# Patient Record
Sex: Male | Born: 1935 | Race: White | Hispanic: No | Marital: Married | Smoking: Former smoker
Health system: Southern US, Community
[De-identification: ages and names within clinical notes are randomized; demographics above are authoritative.]

## PROBLEM LIST (undated history)

## (undated) DIAGNOSIS — C61 Malignant neoplasm of prostate: Secondary | ICD-10-CM

## (undated) DIAGNOSIS — J189 Pneumonia, unspecified organism: Secondary | ICD-10-CM

---

## 2016-05-25 ENCOUNTER — Emergency Department (HOSPITAL_BASED_OUTPATIENT_CLINIC_OR_DEPARTMENT_OTHER): Payer: Medicare Other

## 2016-05-25 ENCOUNTER — Emergency Department (HOSPITAL_BASED_OUTPATIENT_CLINIC_OR_DEPARTMENT_OTHER)
Admission: EM | Admit: 2016-05-25 | Discharge: 2016-05-25 | Disposition: A | Discharge status: Home / Self Care | Payer: Medicare Other | Source: Home / Self Care | Attending: Emergency Medicine | Admitting: Emergency Medicine

## 2016-05-25 ENCOUNTER — Encounter (HOSPITAL_BASED_OUTPATIENT_CLINIC_OR_DEPARTMENT_OTHER): Payer: Self-pay | Admitting: *Deleted

## 2016-05-25 DIAGNOSIS — Z87891 Personal history of nicotine dependence: Secondary | ICD-10-CM

## 2016-05-25 DIAGNOSIS — Z792 Long term (current) use of antibiotics: Secondary | ICD-10-CM

## 2016-05-25 DIAGNOSIS — J069 Acute upper respiratory infection, unspecified: Secondary | ICD-10-CM

## 2016-05-25 DIAGNOSIS — R509 Fever, unspecified: Secondary | ICD-10-CM | POA: Diagnosis not present

## 2016-05-25 DIAGNOSIS — J189 Pneumonia, unspecified organism: Secondary | ICD-10-CM | POA: Diagnosis not present

## 2016-05-25 DIAGNOSIS — Z8546 Personal history of malignant neoplasm of prostate: Secondary | ICD-10-CM | POA: Insufficient documentation

## 2016-05-25 DIAGNOSIS — R05 Cough: Secondary | ICD-10-CM

## 2016-05-25 DIAGNOSIS — R059 Cough, unspecified: Secondary | ICD-10-CM

## 2016-05-25 HISTORY — DX: Malignant neoplasm of prostate: C61

## 2016-05-25 NOTE — ED Provider Notes (Signed)
CSN: [id number]     Arrival date & time 05/25/16  1758 History   By signing my name below, I, Jasmyn B. Alexander, attest that this documentation has been prepared under the direction and in the presence of Lajean Saver, MD.  Electronically Signed: Tedra Coupe. Sheppard Coil, ED Scribe. 05/25/2016. 6:50 PM.  Chief Complaint  Patient presents with  . Generalized Body Aches    The history is provided by the patient and the spouse. No language interpreter was used.    HPI Comments: [place] is a 82 y.o. male, former smoker, who presents to the Emergency Department complaining of gradual onset, constant, generalized body aches and congestion x 4 days. Pt has associated nausea, productive cough, and loss of appetite. Pt's wife called PCP in [place] who prescribed pt Amoxicillin which he has been taking for the past 2 days with mild - mod improvement in symptoms. . Per pt's wife, pt has not had any recent sick contact. He does not have a pulmonary PMHx. Pt denies chest pain, fever, vomiting, diarrhea, throat soreness, or any other flu-like symptoms.       Past Medical History  Diagnosis Date  . Prostate cancer [facility])    History reviewed. No pertinent past surgical history. History reviewed. No pertinent family history. Social History  Substance Use Topics  . Smoking status: Former Research scientist (life sciences)  . Smokeless tobacco: None  . Alcohol Use: No    Review of Systems  Constitutional: Positive for appetite change. Negative for fever.  HENT: Positive for congestion and rhinorrhea. Negative for sore throat.   Eyes: Negative for pain and redness.  Respiratory: Positive for cough. Negative for shortness of breath.   Cardiovascular: Negative for chest pain and leg swelling.  Gastrointestinal: Positive for nausea. Negative for vomiting and diarrhea.  Genitourinary: Negative for dysuria.  Musculoskeletal: Positive for myalgias.  Skin: Negative for rash.  Neurological: Negative for headaches.   Psychiatric/Behavioral: Negative for confusion.  All other systems reviewed and are negative.   Allergies  Review of patient's allergies indicates no known allergies.  Home Medications   Prior to Admission medications   Medication Sig Start Date End Date Taking? Authorizing Provider  amoxicillin (AMOXIL) 500 MG capsule Take 500 mg by mouth 3 (three) times daily.   Yes Historical Provider, MD   BP 102/87 mmHg  Pulse 80  Temp(Src) 99.5 F (37.5 C)  Resp 18  Ht 5\' 7"  (1.702 m)  Wt 185 lb (83.915 kg)  BMI 28.97 kg/m2  SpO2 95% Physical Exam  Constitutional: He appears well-developed and well-nourished.  Mild low-grade fever.  HENT:  Head: Normocephalic and atraumatic.  Mouth/Throat: Oropharynx is clear and moist.  Eyes: Conjunctivae and EOM are normal. No scleral icterus.  Neck: Normal range of motion. Neck supple.  No stiffness or rigidity  Cardiovascular: Normal rate, regular rhythm, normal heart sounds and intact distal pulses.   Pulmonary/Chest: Effort normal. No respiratory distress. He has no wheezes. He has no rales.  Mild upper respiratory congestion. Non prod cough.   Abdominal: Soft. Bowel sounds are normal. He exhibits no distension. There is no tenderness. There is no rebound and no guarding.  Musculoskeletal: Normal range of motion. He exhibits no edema or tenderness.  Neurological: He is alert.  Skin: Skin is warm and dry. No rash noted.  Psychiatric: He has a normal mood and affect. Judgment normal.  Nursing note and vitals reviewed.   ED Course  Procedures (including critical care time) DIAGNOSTIC STUDIES: Oxygen Saturation is 95%  on RA, normal by my interpretation.    COORDINATION OF CARE: 6:29 PM-Discussed treatment plan which includes CXR with pt at bedside and pt agreed to plan.    Dg Chest 2 View  05/25/2016  CLINICAL DATA:  Cough for 1 week EXAM: CHEST  2 VIEW COMPARISON:  None. FINDINGS: No pneumothorax. There is elevation of the left  hemidiaphragm. Mild atelectasis is seen in the left lung base. No pulmonary nodules or masses. No focal infiltrates. The heart, hila, and mediastinum are normal. IMPRESSION: No acute abnormality identified. Mild left basilar atelectasis. Mild elevation of the left hemidiaphragm. Electronically Signed   By: Dorise Bullion III M.D   On: 05/25/2016 18:58       I have personally reviewed and evaluated these images as part of my medical decision-making.    MDM   I personally performed the services described in this documentation, which was scribed in my presence. The recorded information has been reviewed and considered. Lajean Saver, MD  Cxr.  cxr without acute infiltrate.   History/exam felt most c/w viral uri.  Patient currently appears stable for d/c.       Lajean Saver, MD 05/25/16 [place]

## 2016-05-25 NOTE — ED Notes (Signed)
Pt c/o body aches, cough congestion , fever x 1 week

## 2016-05-25 NOTE — ED Notes (Signed)
Pt stating that he started having nasal drainage which then developed into a cough. Pt denies CP. Pt states it started when his daughter turned the A/C on to a low setting. Pt is currently being treated with Amoxicillin. Pt alert and in NAD.

## 2016-05-25 NOTE — Discharge Instructions (Signed)
It was our pleasure to provide your ER care today - we hope that you feel better.  Our radiologist reviewed your xrays and indicates no indication of pneumonia on today's xrays.  Your symptoms and exam are most consistent with an upper respiratory illness which should run its course and get better over the next few days.  As you have started the amoxicillin and feel improved, complete the course of the antibiotic.  Take tylenol/advil as need for fever.   May try mucinex as need for cough/congestion.  Follow up with your doctor in 1 week if symptoms fail to improve/resolve.  Return to ER if worse, new symptoms, chest pain, increased trouble breathing, other concern.    Upper Respiratory Infection, Adult Most upper respiratory infections (URIs) are a viral infection of the air passages leading to the lungs. A URI affects the nose, throat, and upper air passages. The most common type of URI is nasopharyngitis and is typically referred to as "the common cold." URIs run their course and usually go away on their own. Most of the time, a URI does not require medical attention, but sometimes a bacterial infection in the upper airways can follow a viral infection. This is called a secondary infection. Sinus and middle ear infections are common types of secondary upper respiratory infections. Bacterial pneumonia can also complicate a URI. A URI can worsen asthma and chronic obstructive pulmonary disease (COPD). Sometimes, these complications can require emergency medical care and may be life threatening.  CAUSES Almost all URIs are caused by viruses. A virus is a type of germ and can spread from one person to another.  RISKS FACTORS You may be at risk for a URI if:   You smoke.   You have chronic heart or lung disease.  You have a weakened defense (immune) system.   You are very young or very old.   You have nasal allergies or asthma.  You work in crowded or poorly ventilated  areas.  You work in health care facilities or schools. SIGNS AND SYMPTOMS  Symptoms typically develop 2-3 days after you come in contact with a cold virus. Most viral URIs last 7-10 days. However, viral URIs from the influenza virus (flu virus) can last 14-18 days and are typically more severe. Symptoms may include:   Runny or stuffy (congested) nose.   Sneezing.   Cough.   Sore throat.   Headache.   Fatigue.   Fever.   Loss of appetite.   Pain in your forehead, behind your eyes, and over your cheekbones (sinus pain).  Muscle aches.  DIAGNOSIS  Your health care provider may diagnose a URI by:  Physical exam.  Tests to check that your symptoms are not due to another condition such as:  Strep throat.  Sinusitis.  Pneumonia.  Asthma. TREATMENT  A URI goes away on its own with time. It cannot be cured with medicines, but medicines may be prescribed or recommended to relieve symptoms. Medicines may help:  Reduce your fever.  Reduce your cough.  Relieve nasal congestion. HOME CARE INSTRUCTIONS   Take medicines only as directed by your health care provider.   Gargle warm saltwater or take cough drops to comfort your throat as directed by your health care provider.  Use a warm mist humidifier or inhale steam from a shower to increase air moisture. This may make it easier to breathe.  Drink enough fluid to keep your urine clear or pale yellow.   Eat soups and other  clear broths and maintain good nutrition.   Rest as needed.   Return to work when your temperature has returned to normal or as your health care provider advises. You may need to stay home longer to avoid infecting others. You can also use a face mask and careful hand washing to prevent spread of the virus.  Increase the usage of your inhaler if you have asthma.   Do not use any tobacco products, including cigarettes, chewing tobacco, or electronic cigarettes. If you need help quitting,  ask your health care provider. PREVENTION  The best way to protect yourself from getting a cold is to practice good hygiene.   Avoid oral or hand contact with people with cold symptoms.   Wash your hands often if contact occurs.  There is no clear evidence that vitamin C, vitamin E, echinacea, or exercise reduces the chance of developing a cold. However, it is always recommended to get plenty of rest, exercise, and practice good nutrition.  SEEK MEDICAL CARE IF:   You are getting worse rather than better.   Your symptoms are not controlled by medicine.   You have chills.  You have worsening shortness of breath.  You have brown or red mucus.  You have yellow or brown nasal discharge.  You have pain in your face, especially when you bend forward.  You have a fever.  You have swollen neck glands.  You have pain while swallowing.  You have white areas in the back of your throat. SEEK IMMEDIATE MEDICAL CARE IF:   You have severe or persistent:  Headache.  Ear pain.  Sinus pain.  Chest pain.  You have chronic lung disease and any of the following:  Wheezing.  Prolonged cough.  Coughing up blood.  A change in your usual mucus.  You have a stiff neck.  You have changes in your:  Vision.  Hearing.  Thinking.  Mood. MAKE SURE YOU:   Understand these instructions.  Will watch your condition.  Will get help right away if you are not doing well or get worse.   This information is not intended to replace advice given to you by your health care provider. Make sure you discuss any questions you have with your health care provider.   Document Released: 06/07/2001 Document Revised: 04/28/2015 Document Reviewed: 03/19/2014 Elsevier Interactive Patient Education 2016 Elsevier Inc.     Cough, Adult Coughing is a reflex that clears your throat and your airways. Coughing helps to heal and protect your lungs. It is normal to cough occasionally, but a  cough that happens with other symptoms or lasts a long time may be a sign of a condition that needs treatment. A cough may last only 2-3 weeks (acute), or it may last longer than 8 weeks (chronic). CAUSES Coughing is commonly caused by:  Breathing in substances that irritate your lungs.  A viral or bacterial respiratory infection.  Allergies.  Asthma.  Postnasal drip.  Smoking.  Acid backing up from the stomach into the esophagus (gastroesophageal reflux).  Certain medicines.  Chronic lung problems, including COPD (or rarely, lung cancer).  Other medical conditions such as heart failure. HOME CARE INSTRUCTIONS  Pay attention to any changes in your symptoms. Take these actions to help with your discomfort:  Take medicines only as told by your health care provider.  If you were prescribed an antibiotic medicine, take it as told by your health care provider. Do not stop taking the antibiotic even if you start to  feel better.  Talk with your health care provider before you take a cough suppressant medicine.  Drink enough fluid to keep your urine clear or pale yellow.  If the air is dry, use a cold steam vaporizer or humidifier in your bedroom or your home to help loosen secretions.  Avoid anything that causes you to cough at work or at home.  If your cough is worse at night, try sleeping in a semi-upright position.  Avoid cigarette smoke. If you smoke, quit smoking. If you need help quitting, ask your health care provider.  Avoid caffeine.  Avoid alcohol.  Rest as needed. SEEK MEDICAL CARE IF:   You have new symptoms.  You cough up pus.  Your cough does not get better after 2-3 weeks, or your cough gets worse.  You cannot control your cough with suppressant medicines and you are losing sleep.  You develop pain that is getting worse or pain that is not controlled with pain medicines.  You have a fever.  You have unexplained weight loss.  You have night  sweats. SEEK IMMEDIATE MEDICAL CARE IF:  You cough up blood.  You have difficulty breathing.  Your heartbeat is very fast.   This information is not intended to replace advice given to you by your health care provider. Make sure you discuss any questions you have with your health care provider.   Document Released: 06/10/2011 Document Revised: 09/02/2015 Document Reviewed: 02/18/2015 Elsevier Interactive Patient Education [employer].

## 2016-05-27 ENCOUNTER — Encounter (HOSPITAL_BASED_OUTPATIENT_CLINIC_OR_DEPARTMENT_OTHER): Payer: Self-pay | Admitting: *Deleted

## 2016-05-27 ENCOUNTER — Inpatient Hospital Stay (HOSPITAL_BASED_OUTPATIENT_CLINIC_OR_DEPARTMENT_OTHER)
Admission: EM | Admit: 2016-05-27 | Discharge: 2016-05-30 | DRG: 194 | Disposition: A | Discharge status: Home / Self Care | Payer: Medicare Other | Attending: Internal Medicine | Admitting: Internal Medicine

## 2016-05-27 ENCOUNTER — Emergency Department (HOSPITAL_BASED_OUTPATIENT_CLINIC_OR_DEPARTMENT_OTHER): Payer: Medicare Other

## 2016-05-27 DIAGNOSIS — I2510 Atherosclerotic heart disease of native coronary artery without angina pectoris: Secondary | ICD-10-CM | POA: Diagnosis present

## 2016-05-27 DIAGNOSIS — Z7982 Long term (current) use of aspirin: Secondary | ICD-10-CM

## 2016-05-27 DIAGNOSIS — R0902 Hypoxemia: Secondary | ICD-10-CM | POA: Diagnosis present

## 2016-05-27 DIAGNOSIS — Z8701 Personal history of pneumonia (recurrent): Secondary | ICD-10-CM

## 2016-05-27 DIAGNOSIS — E785 Hyperlipidemia, unspecified: Secondary | ICD-10-CM | POA: Diagnosis present

## 2016-05-27 DIAGNOSIS — Z87891 Personal history of nicotine dependence: Secondary | ICD-10-CM

## 2016-05-27 DIAGNOSIS — J189 Pneumonia, unspecified organism: Secondary | ICD-10-CM | POA: Diagnosis present

## 2016-05-27 DIAGNOSIS — I5032 Chronic diastolic (congestive) heart failure: Secondary | ICD-10-CM | POA: Diagnosis present

## 2016-05-27 DIAGNOSIS — I350 Nonrheumatic aortic (valve) stenosis: Secondary | ICD-10-CM | POA: Diagnosis present

## 2016-05-27 DIAGNOSIS — Z8546 Personal history of malignant neoplasm of prostate: Secondary | ICD-10-CM | POA: Diagnosis not present

## 2016-05-27 DIAGNOSIS — Z8249 Family history of ischemic heart disease and other diseases of the circulatory system: Secondary | ICD-10-CM | POA: Diagnosis not present

## 2016-05-27 DIAGNOSIS — R509 Fever, unspecified: Secondary | ICD-10-CM | POA: Diagnosis present

## 2016-05-27 DIAGNOSIS — R001 Bradycardia, unspecified: Secondary | ICD-10-CM | POA: Diagnosis present

## 2016-05-27 DIAGNOSIS — I959 Hypotension, unspecified: Secondary | ICD-10-CM | POA: Diagnosis present

## 2016-05-27 DIAGNOSIS — J9811 Atelectasis: Secondary | ICD-10-CM | POA: Diagnosis present

## 2016-05-27 DIAGNOSIS — I441 Atrioventricular block, second degree: Secondary | ICD-10-CM | POA: Diagnosis present

## 2016-05-27 DIAGNOSIS — Z79899 Other long term (current) drug therapy: Secondary | ICD-10-CM | POA: Diagnosis not present

## 2016-05-27 DIAGNOSIS — R011 Cardiac murmur, unspecified: Secondary | ICD-10-CM | POA: Diagnosis not present

## 2016-05-27 DIAGNOSIS — R0602 Shortness of breath: Secondary | ICD-10-CM

## 2016-05-27 HISTORY — DX: Pneumonia, unspecified organism: J18.9

## 2016-05-27 LAB — URINALYSIS, ROUTINE W REFLEX MICROSCOPIC
BILIRUBIN URINE: NEGATIVE
GLUCOSE, UA: NEGATIVE mg/dL
HGB URINE DIPSTICK: NEGATIVE
Ketones, ur: NEGATIVE mg/dL
Leukocytes, UA: NEGATIVE
NITRITE: NEGATIVE
Protein, ur: NEGATIVE mg/dL
SPECIFIC GRAVITY, URINE: 1.027 (ref 1.005–1.030)
pH: 5.5 (ref 5.0–8.0)

## 2016-05-27 LAB — COMPREHENSIVE METABOLIC PANEL
ALK PHOS: 30 U/L — AB (ref 38–126)
ALT: 36 U/L (ref 17–63)
ANION GAP: 6 (ref 5–15)
AST: 38 U/L (ref 15–41)
Albumin: 3.4 g/dL — ABNORMAL LOW (ref 3.5–5.0)
BUN: 22 mg/dL — ABNORMAL HIGH (ref 6–20)
CALCIUM: 8.3 mg/dL — AB (ref 8.9–10.3)
CHLORIDE: 104 mmol/L (ref 101–111)
CO2: 26 mmol/L (ref 22–32)
Creatinine, Ser: 1.2 mg/dL (ref 0.61–1.24)
GFR, EST NON AFRICAN AMERICAN: 55 mL/min — AB (ref >60)
Glucose, Bld: 127 mg/dL — ABNORMAL HIGH (ref 65–99)
Potassium: 3.8 mmol/L (ref 3.5–5.1)
SODIUM: 136 mmol/L (ref 135–145)
Total Bilirubin: 0.7 mg/dL (ref 0.3–1.2)
Total Protein: 6.5 g/dL (ref 6.5–8.1)

## 2016-05-27 LAB — CBC WITH DIFFERENTIAL/PLATELET
Basophils Absolute: 0 10*3/uL (ref 0.0–0.1)
Basophils Relative: 0 %
EOS ABS: 0 10*3/uL (ref 0.0–0.7)
EOS PCT: 0 %
HCT: 38.4 % — ABNORMAL LOW (ref 39.0–52.0)
Hemoglobin: 13.1 g/dL (ref 13.0–17.0)
LYMPHS ABS: 1.8 10*3/uL (ref 0.7–4.0)
Lymphocytes Relative: 26 %
MCH: 32.9 pg (ref 26.0–34.0)
MCHC: 34.1 g/dL (ref 30.0–36.0)
MCV: 96.5 fL (ref 78.0–100.0)
MONOS PCT: 10 %
Monocytes Absolute: 0.7 10*3/uL (ref 0.1–1.0)
Neutro Abs: 4.3 10*3/uL (ref 1.7–7.7)
Neutrophils Relative %: 64 %
PLATELETS: 290 10*3/uL (ref 150–400)
RBC: 3.98 MIL/uL — ABNORMAL LOW (ref 4.22–5.81)
RDW: 11.4 % — ABNORMAL LOW (ref 11.5–15.5)
WBC: 6.7 10*3/uL (ref 4.0–10.5)

## 2016-05-27 LAB — I-STAT CG4 LACTIC ACID, ED
LACTIC ACID, VENOUS: 1.58 mmol/L (ref 0.5–2.0)
Lactic Acid, Venous: 0.4 mmol/L — ABNORMAL LOW (ref 0.5–2.0)

## 2016-05-27 MED ORDER — IBUPROFEN 800 MG PO TABS
ORAL_TABLET | ORAL | Status: AC
  Administered 2016-05-27: 800 mg via ORAL
  Filled 2016-05-27: qty 1

## 2016-05-27 MED ORDER — AZITHROMYCIN 500 MG IV SOLR
INTRAVENOUS | Status: AC
  Filled 2016-05-27: qty 500

## 2016-05-27 MED ORDER — DEXTROSE 5 % IV SOLN
1.0000 g | Freq: Once | INTRAVENOUS | Status: AC
  Administered 2016-05-27: 1 g via INTRAVENOUS
  Filled 2016-05-27: qty 10

## 2016-05-27 MED ORDER — IOPAMIDOL (ISOVUE-370) INJECTION 76%
100.0000 mL | Freq: Once | INTRAVENOUS | Status: AC | PRN
  Administered 2016-05-27: 100 mL via INTRAVENOUS

## 2016-05-27 MED ORDER — IBUPROFEN 800 MG PO TABS
800.0000 mg | ORAL_TABLET | Freq: Once | ORAL | Status: AC
  Administered 2016-05-27: 800 mg via ORAL

## 2016-05-27 MED ORDER — DEXTROSE 5 % IV SOLN
500.0000 mg | Freq: Once | INTRAVENOUS | Status: AC
  Administered 2016-05-27: 500 mg via INTRAVENOUS

## 2016-05-27 MED ORDER — SODIUM CHLORIDE 0.9 % IV BOLUS (SEPSIS)
1000.0000 mL | Freq: Once | INTRAVENOUS | Status: AC
  Administered 2016-05-27: 1000 mL via INTRAVENOUS

## 2016-05-27 NOTE — ED Notes (Signed)
Family requesting to speak with provider, PA made aware and at bedside for f/u

## 2016-05-27 NOTE — ED Notes (Signed)
EMS reports that the patient was seen here on 05/25/16 for upper respiratory infection.  Was being treated with amoxicillin by his PCP from [place] and showed improvement per patient and family. EMS reports that the patient stopped the amoxicillin because he felt it was not working and called his PCP and had a prescription called in for Levaquin, which was not picked up.  This morning, at 0700, the family reports that the patient had a temperature of 105.1, was given tylenol.  Temperature was not repeated until 1600, which was 101.  EMS reports a temp of 99.5.  CBG was 198.  VS T: 99.5, HR: SR; O2sat: 94, placed on 3 l [place].  Tylenol was given po.  18 G angiocath inserted in left forearm, NS given and zofran for nausea.

## 2016-05-27 NOTE — ED Provider Notes (Signed)
CSN: [id number]     Arrival date & time 05/27/16  1707 History   First MD Initiated Contact with Patient 05/27/16 1739     Chief Complaint  Patient presents with  . Fever   HPI  Mr. Borts is an 82 year old male with PMHx of prostate cancer presenting with fever. Pt reports onset of cough and nasal congestion 1 week ago. He state that his cough is productive of thick phlegm and he feels congested in his chest. Denies associated chest pain or wheezing. Notes some mild SOB when he takes deep breaths but no SOB on exertion. He also notes associated body aches, fatigue, malaise and nausea. Denies vomiting or abdominal pain. He had called his PCP who wrote him an rx for amoxicillin. He has taken this for 3 days with some moderate improvement in symptoms. He states that this morning his fever spiked to 105 degrees. He called his PCP who called him in a prescription for levaquin but he has not filled this. He was seen in ED two days ago for same complaints and diagnosed with viral URI. He denise new symptoms since previous ED visit. He is a former smoker. Denies lung hx. He received tylenol from EMS for temperature of 99.5 but unsure of the dose.   Past Medical History  Diagnosis Date  . Prostate cancer ([place])   . Pneumonia    History reviewed. No pertinent past surgical history. No family history on file. Social History  Substance Use Topics  . Smoking status: Former Research scientist (life sciences)  . Smokeless tobacco: None  . Alcohol Use: No    Review of Systems  All other systems reviewed and are negative.     Allergies  Review of patient's allergies indicates no known allergies.  Home Medications   Prior to Admission medications   Medication Sig Start Date End Date Taking? Authorizing Provider  aspirin 81 MG tablet Take 81 mg by mouth daily.   Yes Historical Provider, MD  co-enzyme Q-10 30 MG capsule Take 30 mg by mouth 3 (three) times daily.   Yes Historical Provider, MD  Multiple Minerals-Vitamins  (CALCIUM & VIT D3 BONE HEALTH PO) Take by mouth.   Yes Historical Provider, MD  rosuvastatin (CRESTOR) 20 MG tablet Take 20 mg by mouth daily.   Yes Historical Provider, MD  amoxicillin (AMOXIL) 500 MG capsule Take 500 mg by mouth 3 (three) times daily.    Historical Provider, MD   BP 92/82 mmHg  Pulse 46  Temp(Src) 97.5 F (36.4 C) (Oral)  Resp 21  Ht 5\' 7"  (1.702 m)  Wt 81.647 kg  BMI 28.19 kg/m2  SpO2 98% Physical Exam  Constitutional: He is oriented to person, place, and time. He appears well-developed and well-nourished. No distress.  HENT:  Head: Normocephalic and atraumatic.  Right Ear: Tympanic membrane and ear canal normal.  Left Ear: Tympanic membrane and ear canal normal.  Nose: Mucosal edema present. Right sinus exhibits no maxillary sinus tenderness and no frontal sinus tenderness. Left sinus exhibits no maxillary sinus tenderness and no frontal sinus tenderness.  Mouth/Throat: Oropharynx is clear and moist. Mucous membranes are dry.  Eyes: Conjunctivae are normal. Right eye exhibits no discharge. Left eye exhibits no discharge. No scleral icterus.  Neck: Normal range of motion.  No meningeal signs  Cardiovascular: Normal rate, regular rhythm and normal heart sounds.   Pulmonary/Chest: Effort normal and breath sounds normal. No respiratory distress. He has no wheezes. He has no rales.  Abdominal: Soft. Bowel  sounds are normal. He exhibits no distension. There is no tenderness. There is no rebound and no guarding.  Musculoskeletal: Normal range of motion.  Neurological: He is alert and oriented to person, place, and time. Coordination normal.  Skin: Skin is warm and dry.  Psychiatric: He has a normal mood and affect. His behavior is normal.  Nursing note and vitals reviewed.   ED Course  Procedures (including critical care time) Labs Review Labs Reviewed  CBC WITH DIFFERENTIAL/PLATELET - Abnormal; Notable for the following:    RBC 3.98 (*)    HCT 38.4 (*)    RDW  11.4 (*)    All other components within normal limits  COMPREHENSIVE METABOLIC PANEL - Abnormal; Notable for the following:    Glucose, Bld 127 (*)    BUN 22 (*)    Calcium 8.3 (*)    Albumin 3.4 (*)    Alkaline Phosphatase 30 (*)    GFR calc non Af Amer 55 (*)    All other components within normal limits  I-STAT CG4 LACTIC ACID, ED - Abnormal; Notable for the following:    Lactic Acid, Venous 0.40 (*)    All other components within normal limits  CULTURE, BLOOD (ROUTINE X 2)  CULTURE, BLOOD (ROUTINE X 2)  URINALYSIS, ROUTINE W REFLEX MICROSCOPIC (NOT AT [facility])  I-STAT CG4 LACTIC ACID, ED    Imaging Review Dg Chest 2 View  05/27/2016  CLINICAL DATA:  Fever. EXAM: CHEST  2 VIEW COMPARISON:  May 25, 2016 FINDINGS: Elevation of the left hemidiaphragm is again identified. Mild atelectasis seen in the left greater than right base. The heart, hila, and mediastinum are unchanged. No pulmonary nodules, masses, or focal infiltrates identified. IMPRESSION: Mild atelectasis in the left lung base. No other acute abnormalities. Electronically Signed   By: Dorise Bullion III M.D   On: 05/27/2016 18:17   Ct Angio Chest Pe W/cm &/or Wo Cm  05/27/2016  CLINICAL DATA:  Hypoxia, fever, cough EXAM: CT ANGIOGRAPHY CHEST WITH CONTRAST TECHNIQUE: Multidetector CT imaging of the chest was performed using the standard protocol during bolus administration of intravenous contrast. Multiplanar CT image reconstructions and MIPs were obtained to evaluate the vascular anatomy. CONTRAST:  100 cc Isovue 370 COMPARISON:  05/27/2016 FINDINGS: Mediastinum/Nodes: No filling defects in the pulmonary arteries to suggest pulmonary emboli. Heart is upper limits normal in size. Aorta is normal caliber. No mediastinal, hilar, or axillary adenopathy. Scattered coronary artery calcifications. Lungs/Pleura: Bibasilar atelectasis. Patchy ground-glass opacities in the right upper lobe and superior segment of the right lower lobe could  reflect mild alveolitis/small airways disease. No effusion. Upper abdomen: Imaging into the upper abdomen shows no acute findings. Musculoskeletal: No acute bony abnormality or focal bone lesion. Review of the MIP images confirms the above findings. IMPRESSION: Bibasilar atelectasis. Patchy ground-glass opacities in the right upper lobe and superior segment of right lower lobe may reflect alveolitis/small airways disease. No evidence of pulmonary embolus. Coronary artery disease. Electronically Signed   By: Rolm Baptise M.D.   On: 05/27/2016 20:20   I have personally reviewed and evaluated these images and lab results as part of my medical decision-making.   EKG Interpretation   Date/Time:  Friday May 27 2016 17:13:59 EDT Ventricular Rate:  74 PR Interval:  313 QRS Duration: 120 QT Interval:  387 QTC Calculation: 429 R Axis:   27 Text Interpretation:  Sinus rhythm Atrial premature complexes Prolonged PR  interval IVCD, consider atypical RBBB Confirmed by MESNER MD, JASON  (  [id number]) on 05/27/2016 5:30:17 PM      MDM   Final diagnoses:  CAP (community acquired pneumonia)   82 year old male presenting with cough, congestion and fever with MAXIMUM TEMPERATURE of 105 prior to arrival. 3 day course of amoxicillin completed without resolution of symptoms. Temperature 103 degrees on arrival to emergency department which reduced to 97.5 after ibuprofen and fluids. Initial oxygen was 86% and patient was placed on 2 L nasal cannula which improved this. Denies history of oxygen requirement. Heart rate initially 80 but has leveled out to 50. Patient's family is at bedside state this is his baseline. Blood pressure mildly hypotensive at 105/60 on average. Patient denies symptoms of dizziness, lightheadedness, near syncope, chest pain, nausea or vomiting. Nasal congestion is noted. No oropharyngeal erythema or exudate. Mucous membranes are dry. Sclera auscultation bilaterally. Heart regular rate and rhythm.  Abdomen is soft, nontender. No leukocytosis. No elevated lactate. UA without signs of infection. Chest x-ray with left basilar atelectasis unchanged from 2 days ago. Given fever and new onset hypoxia, CT chest was ordered. This showed likely right upper lobe and right lower lobe pneumonia. Given azithromycin and ceftriaxone in the emergency department. Given patient's new onset hypoxia, temperature and elevated respiratory rate, code sepsis was called.  Discussed case with Dr. Olevia Bowens at [facility] who initially accepted the patient to stepdown. [facility] at capacity for step[facility] beds so patient case was discussed with Dr. Alcario Drought at [facility] has available stepdown beds and patient is accepted by Dr. Alcario Drought for transfer.  Lahoma Crocker Tiane Szydlowski, PA-C 05/28/16 0123  Merrily Pew, MD 05/28/16 [id number]

## 2016-05-27 NOTE — ED Notes (Signed)
Patient spouse reports patient had a temp of 105 just prior to calling ems, which they did not treat.

## 2016-05-27 NOTE — ED Notes (Signed)
Per family member pt hr runs in the 61's  Which is normal for him

## 2016-05-27 NOTE — Progress Notes (Signed)
Patient ID: Jake Acevedo, male   DOB: [birth year], 82 y.o.   MRN: [id number] Please call the floor manager at extension [phone] for admitting physician assignment once the patient arrives to the [facility] unit.    Per Josephina Gip, PA-C Chief Complaint  Patient presents with  . Fever   HPI  Jake Acevedo is an 82 year old male with PMHx of prostate cancer presenting with fever. Pt reports onset of cough and nasal congestion 1 week ago. He state that his cough is productive of thick phlegm and he feels congested in his chest. Denies associated chest pain or wheezing. Notes some mild SOB when he takes deep breaths but no SOB on exertion. He also notes associated body aches, fatigue, malaise and nausea. Denies vomiting or abdominal pain. He had called his PCP who wrote him an rx for for amoxicillin. He has taken this for 3 days with some moderate improvement in symptoms. He states that this morning his fever spiked to 105 degrees. He called his PCP who called him in a prescription for levaquin but he has not filled this. He was seen in ED two days ago for same complaints and diagnosed with viral URI. He denise new symptoms since previous ED visit. He is a former smoker. Denies lung hx. He received tylenol from EMS for temperature of 99.5.           Component Value Units   I-Stat CG4 Lactic Acid, ED [id number] (Abnormal) Collected: 05/27/16 2256   Updated: 05/27/16 2259    Specimen Type: Blood     Lactic Acid, Venous 0.40 (L) mmol/L   Urinalysis, Routine w reflex microscopic (not at [facility]) [id number] Collected: 05/27/16 1910   Updated: 05/27/16 2117    Specimen Type: Urine    Specimen Source: Urine, Clean Catch     Color, Urine YELLOW    APPearance CLEAR    Specific Gravity, Urine 1.027    pH 5.5    Glucose, UA NEGATIVE mg/dL    Hgb urine dipstick NEGATIVE    Bilirubin Urine NEGATIVE    Ketones, ur NEGATIVE mg/dL    Protein, ur NEGATIVE mg/dL    Nitrite NEGATIVE     Leukocytes, UA NEGATIVE   Blood culture (routine x 2) [id number] Collected: 05/27/16 1710   Updated: 05/27/16 1942    Specimen Type: Blood    Blood culture (routine x 2) [id number] Collected: 05/27/16 1715   Updated: 05/27/16 1941    Specimen Type: Blood    Comprehensive metabolic panel [id number] (Abnormal) Collected: 05/27/16 1715   Updated: 05/27/16 1814    Specimen Type: Blood    Specimen Source: Vein     Sodium 136 mmol/L    Potassium 3.8 mmol/L    Chloride 104 mmol/L    CO2 26 mmol/L    Glucose, Bld 127 (H) mg/dL    BUN 22 (H) mg/dL    Creatinine, Ser 1.20 mg/dL    Calcium 8.3 (L) mg/dL    Total Protein 6.5 g/dL    Albumin 3.4 (L) g/dL    AST 38 U/L    ALT 36 U/L    Alkaline Phosphatase 30 (L) U/L    Total Bilirubin 0.7 mg/dL    GFR calc non Af Amer 55 (L) mL/min    GFR calc Af Amer >60 mL/min    Anion gap 6   CBC with Differential [id number] (Abnormal) Collected: 05/27/16 1715   Updated: 05/27/16 1756    Specimen Type: Blood    Specimen Source: Vein  WBC 6.7 K/uL    RBC 3.98 (L) MIL/uL    Hemoglobin 13.1 g/dL    HCT 38.4 (L) %    MCV 96.5 fL    MCH 32.9 pg    MCHC 34.1 g/dL    RDW 11.4 (L) %    Platelets 290 K/uL    Neutrophils Relative % 64 %    Neutro Abs 4.3 K/uL    Lymphocytes Relative 26 %    Lymphs Abs 1.8 K/uL    Monocytes Relative 10 %    Monocytes Absolute 0.7 K/uL    Eosinophils Relative 0 %    Eosinophils Absolute 0.0 K/uL    Basophils Relative 0 %    Basophils Absolute 0.0 K/uL   I-Stat CG4 Lactic Acid, ED [id number] Collected: 05/27/16 1738   Updated: 05/27/16 1744    Specimen Type: Blood     Lactic Acid, Venous 1.58 mmol/L     Vent. rate 46 BPM PR interval 223 ms QRS duration 125 ms QT/QTc 496/434 ms P-R-T axes 15 40 27 Sinus bradycardia Borderline prolonged PR interval Right bundle branch block  CT angiogram of chest PE protocol.  IMPRESSION: Bibasilar  atelectasis.  Patchy ground-glass opacities in the right upper lobe and superior segment of right lower lobe may reflect alveolitis/small airways disease.  No evidence of pulmonary embolus.  Coronary artery disease.   Electronically Signed  By: Rolm Baptise M.D.  On: 05/27/2016 20:20  --------------------------------------------------------------------------------------------------------  Tennis Must, M.D. [phone].

## 2016-05-27 NOTE — ED Notes (Signed)
Pt states he is unable to urinate at this time. Pt states he tried, but nothing will happen. Pt is told that when he feels he can go to let us know.

## 2016-05-27 NOTE — ED Provider Notes (Addendum)
Medical screening examination/treatment/procedure(s) were conducted as a shared visit with non-physician practitioner(s) and myself.  I personally evaluated the patient during the encounter.  A few days' worth of cough, fever as high as 105 temporally at home. Here he is hypoxic and bradycardic. Chest x-ray negative so we ordered CT to make sure wasn't a PE and to further evaluate lungs which showed evidence of likely pneumonia. This with his hypoxia, will start abx, will admit.    EKG Interpretation   Date/Time:  Friday May 27 2016 17:13:59 EDT Ventricular Rate:  74 PR Interval:  313 QRS Duration: 120 QT Interval:  387 QTC Calculation: 429 R Axis:   27 Text Interpretation:  Sinus rhythm Atrial premature complexes Prolonged PR  interval IVCD, consider atypical RBBB Confirmed by [facility] MD, Gevon Markus  [phone]) on 05/27/2016 5:30:17 PM       Merrily Pew, MD 05/28/16 1507

## 2016-05-28 ENCOUNTER — Inpatient Hospital Stay (HOSPITAL_COMMUNITY): Payer: Medicare Other

## 2016-05-28 DIAGNOSIS — J189 Pneumonia, unspecified organism: Principal | ICD-10-CM

## 2016-05-28 DIAGNOSIS — R011 Cardiac murmur, unspecified: Secondary | ICD-10-CM

## 2016-05-28 DIAGNOSIS — I441 Atrioventricular block, second degree: Secondary | ICD-10-CM

## 2016-05-28 LAB — INFLUENZA PANEL BY PCR (TYPE A & B)
H1N1 flu by pcr: NOT DETECTED
INFLAPCR: NEGATIVE
Influenza B By PCR: NEGATIVE

## 2016-05-28 LAB — EXPECTORATED SPUTUM ASSESSMENT W REFEX TO RESP CULTURE

## 2016-05-28 LAB — TSH: TSH: 2.518 u[IU]/mL (ref 0.350–4.500)

## 2016-05-28 LAB — HIV ANTIBODY (ROUTINE TESTING W REFLEX): HIV SCREEN 4TH GENERATION: NONREACTIVE

## 2016-05-28 LAB — ECHOCARDIOGRAM COMPLETE
HEIGHTINCHES: 67 in
Weight: 2892.44 oz

## 2016-05-28 LAB — STREP PNEUMONIAE URINARY ANTIGEN: STREP PNEUMO URINARY ANTIGEN: NEGATIVE

## 2016-05-28 LAB — EXPECTORATED SPUTUM ASSESSMENT W GRAM STAIN, RFLX TO RESP C

## 2016-05-28 LAB — MRSA PCR SCREENING: MRSA by PCR: NEGATIVE

## 2016-05-28 MED ORDER — IBUPROFEN 200 MG PO TABS: 600.0000 mg | ORAL_TABLET | Freq: Four times a day (QID) | ORAL | Status: DC | PRN

## 2016-05-28 MED ORDER — ASPIRIN 81 MG PO CHEW
81.0000 mg | CHEWABLE_TABLET | Freq: Every day | ORAL | Status: DC
  Administered 2016-05-28 – 2016-05-30 (×3): 81 mg via ORAL
  Filled 2016-05-28 (×3): qty 1

## 2016-05-28 MED ORDER — SODIUM CHLORIDE 0.9 % IV BOLUS (SEPSIS)
500.0000 mL | Freq: Once | INTRAVENOUS | Status: AC
Start: 2016-05-28 — End: 2016-05-28
  Administered 2016-05-28: 500 mL via INTRAVENOUS

## 2016-05-28 MED ORDER — ROSUVASTATIN CALCIUM 20 MG PO TABS
20.0000 mg | ORAL_TABLET | Freq: Every day | ORAL | Status: DC
  Administered 2016-05-28 – 2016-05-29 (×2): 20 mg via ORAL
  Filled 2016-05-28 (×2): qty 1

## 2016-05-28 MED ORDER — ACETAMINOPHEN 325 MG PO TABS
650.0000 mg | ORAL_TABLET | Freq: Four times a day (QID) | ORAL | Status: DC | PRN
  Administered 2016-05-28 (×2): 650 mg via ORAL
  Filled 2016-05-28 (×2): qty 2

## 2016-05-28 MED ORDER — SODIUM CHLORIDE 0.9 % IV SOLN
INTRAVENOUS | Status: DC
  Administered 2016-05-28: 06:00:00 via INTRAVENOUS

## 2016-05-28 MED ORDER — SODIUM CHLORIDE 0.9 % IV SOLN: INTRAVENOUS | Status: DC

## 2016-05-28 MED ORDER — SODIUM CHLORIDE 0.9 % IV SOLN
INTRAVENOUS | Status: AC
  Administered 2016-05-28: 04:00:00 via INTRAVENOUS

## 2016-05-28 MED ORDER — DEXTROSE 5 % IV SOLN
500.0000 mg | INTRAVENOUS | Status: DC
  Administered 2016-05-28 – 2016-05-29 (×2): 500 mg via INTRAVENOUS
  Filled 2016-05-28 (×3): qty 500

## 2016-05-28 MED ORDER — ENOXAPARIN SODIUM 40 MG/0.4ML ~~LOC~~ SOLN
40.0000 mg | SUBCUTANEOUS | Status: DC
  Administered 2016-05-28 – 2016-05-30 (×3): 40 mg via SUBCUTANEOUS
  Filled 2016-05-28 (×3): qty 0.4

## 2016-05-28 MED ORDER — DEXTROSE 5 % IV SOLN
1.0000 g | INTRAVENOUS | Status: DC
  Administered 2016-05-28 – 2016-05-29 (×2): 1 g via INTRAVENOUS
  Filled 2016-05-28 (×3): qty 10

## 2016-05-28 MED ORDER — SODIUM CHLORIDE 0.9 % IV BOLUS (SEPSIS)
1000.0000 mL | Freq: Once | INTRAVENOUS | Status: AC
  Administered 2016-05-28: 1000 mL via INTRAVENOUS

## 2016-05-28 NOTE — Consult Note (Signed)
CONSULT NOTE  Date: 05/28/2016               Patient Name:  Jake Acevedo MRN: [id number]  DOB: 09/02/1933 Age / Sex: 82 y.o., male        PCP: No PCP Per Patient Primary Cardiologist: New / N[place]            Referring Physician: Ronnie Derby              Reason for Consult: bradycardia           History of Present Illness: Patient is a 82 y.o. male with a PMHx of bradycardia, prostate cancer , who was admitted to [facility] on 05/27/2016 for evaluation of temperature of 104, cough and pneumonia.  Is visiting [place] - from [place]. Was noticed to have a slow HR in the ER and we were asked to see  Talked to patient and his wife. He has had a slow HR for years.   Has had numerous stress tests over the years.     .   Developed cough and fever about a week ago.  His primary MD called in Amoxicillin .  No improvement.   PCP gave him Legaquin but he did not get it filled.   Former smoker  + family hx of CAD.     Medications: Outpatient medications: Prescriptions prior to admission  Medication Sig Dispense Refill Last Dose  . aspirin 81 MG tablet Take 81 mg by mouth daily.   05/27/2016 at 0800  . Cholecalciferol (VITAMIN D-3) 1000 units CAPS Take 1,000 Units by mouth daily.   05/24/2016  . co-enzyme Q-10 30 MG capsule Take 30 mg by mouth daily. Reported on 05/28/2016   05/24/2016  . rosuvastatin (CRESTOR) 20 MG tablet Take 20 mg by mouth daily.   05/24/2016  . amoxicillin (AMOXIL) 500 MG capsule Take 500 mg by mouth 3 (three) times daily. Reported on 05/28/2016   Not Taking at Unknown time    Current medications: Current Facility-Administered Medications  Medication Dose Route Frequency Provider Last Rate Last Dose  . 0.9 %  sodium chloride infusion   Intravenous Continuous Thurnell Lose, MD      . acetaminophen (TYLENOL) tablet 650 mg  650 mg Oral Q6H PRN Etta Quill, DO      . aspirin chewable tablet 81 mg  81 mg Oral Daily Jared M Gardner, DO      . azithromycin (ZITHROMAX) 500 mg in  dextrose 5 % 250 mL IVPB  500 mg Intravenous Q24H Jared M Gardner, DO      . cefTRIAXone (ROCEPHIN) 1 g in dextrose 5 % 50 mL IVPB  1 g Intravenous Q24H Etta Quill, DO      . enoxaparin (LOVENOX) injection 40 mg  40 mg Subcutaneous Q24H Jared M Gardner, DO      . ibuprofen (ADVIL,MOTRIN) tablet 600 mg  600 mg Oral Q6H PRN Etta Quill, DO      . rosuvastatin (CRESTOR) tablet 20 mg  20 mg Oral Daily Etta Quill, DO         No Known Allergies   Past Medical History  Diagnosis Date  . Prostate cancer ([place])   . Pneumonia     History reviewed. No pertinent past surgical history.  No family history on file.  Social History:  reports that he has quit smoking. He does not have any smokeless tobacco history on file. He reports that he  does not drink alcohol or use illicit drugs.   Review of Systems: Constitutional:  admits to fever, chills, diaphoresis, appetite change and fatigue.  HEENT: denies photophobia, eye pain, redness, hearing loss, ear pain, congestion, sore throat, rhinorrhea, sneezing, neck pain, neck stiffness and tinnitus.  Respiratory: admits to SOB,   wheezing.  Cardiovascular: denies chest pain, palpitations and leg swelling.  Gastrointestinal: denies nausea, vomiting, abdominal pain, diarrhea, constipation, blood in stool.  Genitourinary: denies dysuria, urgency, frequency, hematuria, flank pain and difficulty urinating.  Musculoskeletal: denies  myalgias, back pain, joint swelling, arthralgias and gait problem.   Skin: denies pallor, rash and wound.  Neurological: denies dizziness, seizures, syncope, weakness, light-headedness, numbness and headaches.   Hematological: denies adenopathy, easy bruising, personal or family bleeding history.  Psychiatric/ Behavioral: denies suicidal ideation, mood changes, confusion, nervousness, sleep disturbance and agitation.    Physical Exam: BP 110/62 mmHg  Pulse 64  Temp(Src) 98.8 F (37.1 C) (Oral)  Resp 13  Ht 5'  7" (1.702 m)  Wt 180 lb 12.4 oz (82 kg)  BMI 28.31 kg/m2  SpO2 94%  Wt Readings from Last 3 Encounters:  05/28/16 180 lb 12.4 oz (82 kg)  05/25/16 185 lb (83.915 kg)    General: Vital signs reviewed and noted. Well-developed, well-nourished, in no acute distress; alert, sleepy this am   Head: Normocephalic, atraumatic, sclera anicteric,   Neck: Supple. Negative for carotid bruits. No JVD   Lungs:  Rhonchi   Heart: RRR with S1 S2. [id number] systolic  murmur . HR is slow   Abdomen/ GI :  Soft, non-tender, non-distended with normoactive bowel sounds. No hepatomegaly. No rebound/guarding. No obvious abdominal masses   MSK: Strength and the appear normal for age.   Extremities: No clubbing or cyanosis. No edema.  Distal pedal pulses are 2+ and equal   Neurologic:  CN are grossly intact,  No obvious motor or sensory defect.  Alert and oriented X 3. Moves all extremities spontaneously.  Psych: Responds to questions appropriately with a normal affect.     Lab results: Basic Metabolic Panel:  Recent Labs Lab 05/27/16 1715  NA 136  K 3.8  CL 104  CO2 26  GLUCOSE 127*  BUN 22*  CREATININE 1.20  CALCIUM 8.3*    Liver Function Tests:  Recent Labs Lab 05/27/16 1715  AST 38  ALT 36  ALKPHOS 30*  BILITOT 0.7  PROT 6.5  ALBUMIN 3.4*   No results for input(s): LIPASE, AMYLASE in the last 168 hours. No results for input(s): AMMONIA in the last 168 hours.  CBC:  Recent Labs Lab 05/27/16 1715  WBC 6.7  NEUTROABS 4.3  HGB 13.1  HCT 38.4*  MCV 96.5  PLT 290    Cardiac Enzymes: No results for input(s): CKTOTAL, CKMB, CKMBINDEX, TROPONINI in the last 168 hours.  BNP: Invalid input(s): POCBNP  CBG: No results for input(s): GLUCAP in the last 168 hours.  Coagulation Studies: No results for input(s): LABPROT, INR in the last 72 hours.   Other results:  Personal review of EKG shows :  -sinus brady , 1st degree AV block      Imaging: Dg Chest 2 View  05/27/2016   CLINICAL DATA:  Fever. EXAM: CHEST  2 VIEW COMPARISON:  May 25, 2016 FINDINGS: Elevation of the left hemidiaphragm is again identified. Mild atelectasis seen in the left greater than right base. The heart, hila, and mediastinum are unchanged. No pulmonary nodules, masses, or focal infiltrates identified. IMPRESSION: Mild atelectasis in  the left lung base. No other acute abnormalities. Electronically Signed   By: Dorise Bullion III M.D   On: 05/27/2016 18:17   Ct Angio Chest Pe W/cm &/or Wo Cm  05/27/2016  CLINICAL DATA:  Hypoxia, fever, cough EXAM: CT ANGIOGRAPHY CHEST WITH CONTRAST TECHNIQUE: Multidetector CT imaging of the chest was performed using the standard protocol during bolus administration of intravenous contrast. Multiplanar CT image reconstructions and MIPs were obtained to evaluate the vascular anatomy. CONTRAST:  100 cc Isovue 370 COMPARISON:  05/27/2016 FINDINGS: Mediastinum/Nodes: No filling defects in the pulmonary arteries to suggest pulmonary emboli. Heart is upper limits normal in size. Aorta is normal caliber. No mediastinal, hilar, or axillary adenopathy. Scattered coronary artery calcifications. Lungs/Pleura: Bibasilar atelectasis. Patchy ground-glass opacities in the right upper lobe and superior segment of the right lower lobe could reflect mild alveolitis/small airways disease. No effusion. Upper abdomen: Imaging into the upper abdomen shows no acute findings. Musculoskeletal: No acute bony abnormality or focal bone lesion. Review of the MIP images confirms the above findings. IMPRESSION: Bibasilar atelectasis. Patchy ground-glass opacities in the right upper lobe and superior segment of right lower lobe may reflect alveolitis/small airways disease. No evidence of pulmonary embolus. Coronary artery disease. Electronically Signed   By: Rolm Baptise M.D.   On: 05/27/2016 20:20       Assessment & Plan:  1. Bradycardia:   With 1st degree AV block .  No hx of syncope.   HR was in teh  30s last night - in the setting of a temperature of 104-105. No syncope or presyncope in teh past or durrently .  At this point , I would observe.   No additional testing needed.   He has a cardiologist in [place] who will follow up with him.   2. Heart murmur:   Likely has AS .  Will get an echo .  Has not been told that he has a murmur in the past    3. Pneumonia :   Plans per IM    Ramond Dial., MD, [facility] 05/28/2016, 8:38 AM Office - [phone] Pager [phone]

## 2016-05-28 NOTE — H&P (Signed)
History and Physical    Jake Acevedo [id number] DOB: 13-Feb-1933 DOA: 05/27/2016   PCP: No PCP Per Patient Chief Complaint:  Chief Complaint  Patient presents with  . Fever    HPI: Jake Acevedo is a 82 y.o. male with medical history significant of prostate cancer, patient presents to the ED at [facility] with c/o cough, fever as high as 104 at home this evening.  Pt reports onset of cough and nasal congestion 1 week ago. He state that his cough is productive of thick phlegm and he feels congested in his chest. Denies associated chest pain or wheezing. Notes some mild SOB when he takes deep breaths but no SOB on exertion. He also notes associated body aches, fatigue, malaise and nausea. Denies vomiting or abdominal pain. He had called his PCP who wrote him an rx for for amoxicillin. He has taken this for 3 days with some moderate improvement in symptoms. He states that this morning his fever spiked to 105 degrees. He called his PCP who called him in a prescription for levaquin but he has not filled this. He was seen in ED two days ago for same complaints and diagnosed with viral URI. He denise new symptoms since previous ED visit. He is a former smoker. Denies lung hx.  ED Course: Work up in ED demonstrated RUL and RLL PNA on CT chest, started on rocephin and azithromycin and given tylenol / motrin for fever.  He was initially borderline hypotensive but after 3L IVF his BP has significantly improved to 148/62 by the time he arrives to [facility] SDU.  Review of Systems: As per HPI otherwise 10 point review of systems negative.  Patient denies any cardiac or pulmonary problems on PMH.   Past Medical History  Diagnosis Date  . Prostate cancer ([place])   . Pneumonia     History reviewed. No pertinent past surgical history.   reports that he has quit smoking. He does not have any smokeless tobacco history on file. He reports that he does not drink alcohol or use illicit drugs.  No Known Allergies  No family  history on file. No one sick with pneumonia recently.   Prior to Admission medications   Medication Sig Start Date End Date Taking? Authorizing Provider  aspirin 81 MG tablet Take 81 mg by mouth daily.   Yes Historical Provider, MD  Cholecalciferol (VITAMIN D-3) 1000 units CAPS Take 1,000 Units by mouth daily.   Yes Historical Provider, MD  co-enzyme Q-10 30 MG capsule Take 30 mg by mouth daily. Reported on 05/28/2016   Yes Historical Provider, MD  rosuvastatin (CRESTOR) 20 MG tablet Take 20 mg by mouth daily.   Yes Historical Provider, MD  amoxicillin (AMOXIL) 500 MG capsule Take 500 mg by mouth 3 (three) times daily. Reported on 05/28/2016    Historical Provider, MD    Physical Exam: Filed Vitals:   05/28/16 0450 05/28/16 0500 05/28/16 0510 05/28/16 0527  BP:  151/62 148/62   Pulse: 58 56 51 59  Temp:    98.5 F (36.9 C)  TempSrc:    Oral  Resp: 18 17 15 19   Height:      Weight:      SpO2: 92% 100% 100% 100%      Constitutional: NAD, calm, ill appearing, shivering Eyes: PERRL, lids and conjunctivae normal ENMT: Mucous membranes are moist. Posterior pharynx clear of any exudate or lesions.Normal dentition.  Neck: normal, supple, no masses, no thyromegaly Respiratory: clear to auscultation bilaterally, no  wheezing, no crackles. Normal respiratory effort. No accessory muscle use.  Cardiovascular: Regular rate and rhythm, no murmurs / rubs / gallops. No extremity edema. 2+ pedal pulses. No carotid bruits.  Abdomen: no tenderness, no masses palpated. No hepatosplenomegaly. Bowel sounds positive.  Musculoskeletal: no clubbing / cyanosis. No joint deformity upper and lower extremities. Good ROM, no contractures. Normal muscle tone.  Skin: no rashes, lesions, ulcers. No induration Neurologic: CN 2-12 grossly intact. Sensation intact, DTR normal. Strength 5/5 in all 4.  Psychiatric: Normal judgment and insight. Alert and oriented x 3. Normal mood.    Labs on Admission: I have personally  reviewed following labs and imaging studies  CBC:  Recent Labs Lab 05/27/16 1715  WBC 6.7  NEUTROABS 4.3  HGB 13.1  HCT 38.4*  MCV 96.5  PLT [id number]   Basic Metabolic Panel:  Recent Labs Lab 05/27/16 1715  NA 136  K 3.8  CL 104  CO2 26  GLUCOSE 127*  BUN 22*  CREATININE 1.20  CALCIUM 8.3*   GFR: Estimated Creatinine Clearance: 50.3 mL/min (by C-G formula based on Cr of 1.2). Liver Function Tests:  Recent Labs Lab 05/27/16 1715  AST 38  ALT 36  ALKPHOS 30*  BILITOT 0.7  PROT 6.5  ALBUMIN 3.4*   No results for input(s): LIPASE, AMYLASE in the last 168 hours. No results for input(s): AMMONIA in the last 168 hours. Coagulation Profile: No results for input(s): INR, PROTIME in the last 168 hours. Cardiac Enzymes: No results for input(s): CKTOTAL, CKMB, CKMBINDEX, TROPONINI in the last 168 hours. BNP (last 3 results) No results for input(s): PROBNP in the last 8760 hours. HbA1C: No results for input(s): HGBA1C in the last 72 hours. CBG: No results for input(s): GLUCAP in the last 168 hours. Lipid Profile: No results for input(s): CHOL, HDL, LDLCALC, TRIG, CHOLHDL, LDLDIRECT in the last 72 hours. Thyroid Function Tests: No results for input(s): TSH, T4TOTAL, FREET4, T3FREE, THYROIDAB in the last 72 hours. Anemia Panel: No results for input(s): VITAMINB12, FOLATE, FERRITIN, TIBC, IRON, RETICCTPCT in the last 72 hours. Urine analysis:    Component Value Date/Time   COLORURINE YELLOW 05/27/2016 1910   APPEARANCEUR CLEAR 05/27/2016 1910   LABSPEC 1.027 05/27/2016 1910   PHURINE 5.5 05/27/2016 1910   GLUCOSEU NEGATIVE 05/27/2016 1910   HGBUR NEGATIVE 05/27/2016 1910   BILIRUBINUR NEGATIVE 05/27/2016 1910   KETONESUR NEGATIVE 05/27/2016 1910   PROTEINUR NEGATIVE 05/27/2016 1910   NITRITE NEGATIVE 05/27/2016 1910   LEUKOCYTESUR NEGATIVE 05/27/2016 1910   Sepsis Labs: @LABRCNTIP (procalcitonin:4,lacticidven:4) ) Recent Results (from the past 240 hour(s))    Blood culture (routine x 2)     Status: None (Preliminary result)   Collection Time: 05/27/16  5:10 PM  Result Value Ref Range Status   Specimen Description BLOOD LEFT ANTECUBITAL  Final   Special Requests   Final    BOTTLES DRAWN AEROBIC AND ANAEROBIC [place] Performed at [facility]    Culture PENDING  Incomplete   Report Status PENDING  Incomplete  Blood culture (routine x 2)     Status: None (Preliminary result)   Collection Time: 05/27/16  5:15 PM  Result Value Ref Range Status   Specimen Description BLOOD RIGHT ANTECUBITAL  Final   Special Requests   Final    BOTTLES DRAWN AEROBIC AND ANAEROBIC [place] Performed at [facility]    Culture PENDING  Incomplete   Report Status PENDING  Incomplete  MRSA PCR Screening     Status: None  Collection Time: 05/28/16  4:02 AM  Result Value Ref Range Status   MRSA by PCR NEGATIVE NEGATIVE Final    Comment:        The GeneXpert MRSA Assay (FDA approved for NASAL specimens only), is one component of a comprehensive MRSA colonization surveillance program. It is not intended to diagnose MRSA infection nor to guide or monitor treatment for MRSA infections.      Radiological Exams on Admission: Dg Chest 2 View  05/27/2016  CLINICAL DATA:  Fever. EXAM: CHEST  2 VIEW COMPARISON:  May 25, 2016 FINDINGS: Elevation of the left hemidiaphragm is again identified. Mild atelectasis seen in the left greater than right base. The heart, hila, and mediastinum are unchanged. No pulmonary nodules, masses, or focal infiltrates identified. IMPRESSION: Mild atelectasis in the left lung base. No other acute abnormalities. Electronically Signed   By: Dorise Bullion III M.D   On: 05/27/2016 18:17   Ct Angio Chest Pe W/cm &/or Wo Cm  05/27/2016  CLINICAL DATA:  Hypoxia, fever, cough EXAM: CT ANGIOGRAPHY CHEST WITH CONTRAST TECHNIQUE: Multidetector CT imaging of the chest was performed using the standard protocol during bolus administration of  intravenous contrast. Multiplanar CT image reconstructions and MIPs were obtained to evaluate the vascular anatomy. CONTRAST:  100 cc Isovue 370 COMPARISON:  05/27/2016 FINDINGS: Mediastinum/Nodes: No filling defects in the pulmonary arteries to suggest pulmonary emboli. Heart is upper limits normal in size. Aorta is normal caliber. No mediastinal, hilar, or axillary adenopathy. Scattered coronary artery calcifications. Lungs/Pleura: Bibasilar atelectasis. Patchy ground-glass opacities in the right upper lobe and superior segment of the right lower lobe could reflect mild alveolitis/small airways disease. No effusion. Upper abdomen: Imaging into the upper abdomen shows no acute findings. Musculoskeletal: No acute bony abnormality or focal bone lesion. Review of the MIP images confirms the above findings. IMPRESSION: Bibasilar atelectasis. Patchy ground-glass opacities in the right upper lobe and superior segment of right lower lobe may reflect alveolitis/small airways disease. No evidence of pulmonary embolus. Coronary artery disease. Electronically Signed   By: Rolm Baptise M.D.   On: 05/27/2016 20:20    EKG: Independently reviewed.  Assessment/Plan Principal Problem:   CAP (community acquired pneumonia) Active Problems:   Mobitz type 1 second degree atrioventricular block   CAP -  PNA pathway  Cultures pending  Rocephin and azithromycin  Tylenol PRN fever, motrin prn refractory fever  Initially borderline hypotensive in ED, his BP has improved dramatically with 3L IVF, will back down the fluid rate to 50 for now.  Make KVO if PO intake is good  Technically no other SIRS  Mobitz 1 -  Per wife patient always has a "slow" heart rate  My review of his EKG today is suggestive of a Mobitz type 1 block.  At the very least PR interval is definitely not consistent from beat to beat.  Wife states that his "cardiologist found nothing wrong with his heart" when I ask about PMH  Will try to send for  outside records to see if this is new or not.   DVT prophylaxis: Lovenox Code Status: Full Family Communication: Wife at bedside Consults called: None Admission status: Admit to inpatient   Etta Quill DO [facility] Pager [phone] from 7PM-7AM  If 7AM-7PM, please contact the day physician for the patient [email or web address] Password [email or web address]  05/28/2016, 5:55 AM

## 2016-05-28 NOTE — Progress Notes (Signed)
Dr. Gardner at bedside 

## 2016-05-28 NOTE — Progress Notes (Addendum)
PROGRESS NOTE                                                                                                                                                                                                             Patient Demographics:    Jake Acevedo, is a 82 y.o. male, DOB - August 25, 1933, [id number]  Admit date - 05/27/2016   Admitting Physician Etta Quill, DO  Outpatient Primary MD for the patient is No PCP Per Patient  LOS - 1  Chief Complaint  Patient presents with  . Fever       Brief Narrative    Jake Acevedo is a 82 y.o. male with medical history significant of prostate cancer, patient presents to the ED at [facility] with c/o cough, fever as high as 104 at home this evening. Pt reports onset of cough and nasal congestion 1 week ago. He state that his cough is productive of thick phlegm and he feels congested in his chest. Denies associated chest pain or wheezing. Notes some mild SOB when he takes deep breaths but no SOB on exertion. He also notes associated body aches, fatigue, malaise and nausea. Denies vomiting or abdominal pain. He had called his PCP who wrote him an rx for for amoxicillin. He has taken this for 3 days with some moderate improvement in symptoms. He states that this morning his fever spiked to 105 degrees. He called his PCP who called him in a prescription for levaquin but he has not filled this. He was seen in ED two days ago for same complaints and diagnosed with viral URI. He denise new symptoms since previous ED visit. He is a former smoker. Denies lung hx.  ED Course: Work up in ED demonstrated RUL and RLL PNA on CT chest, started on rocephin and azithromycin and given tylenol / motrin for fever. He was initially borderline hypotensive but after 3L IVF his BP has significantly improved to 148/62 by the time he arrives to [facility].   Subjective:    Jake Acevedo today has, No headache, No chest pain,  No abdominal pain - No Nausea, No new weakness tingling or numbness, Positive cough but improved shortness of breath.   Assessment  & Plan :    Despite anemia. Continue statin. 1.CAP. Symptoms ongoing for at least 4-5 days, fever up to  103, CT chest suggestive of pneumonia, surprisingly he does not have leukocytosis, although it's been close to 5 days we'll check influenza PCR, for now continue empiric antibiotics, continue supportive care with oxygen and nebulizer treatments. Added flutter valve, encouraged to sit up in the chair, follow cultures.  2. Question Wenckebach on initial EKG. Cardiology has been consulted, avoiding rate controlling agents.  3.Dyslipidemia. Continue home dose statin.    Code Status :  Full  Family Communication  : Wife bedside  Disposition Plan  : Home in 1-2 days for now stay and stepdown  Consults  :  Cards  Procedures  :     CT angiogram. Right upper lobe opacity.  DVT Prophylaxis  :  Lovenox    Lab Results  Component Value Date   PLT 290 05/27/2016    Inpatient Medications  Scheduled Meds: . aspirin  81 mg Oral Daily  . azithromycin  500 mg Intravenous Q24H  . cefTRIAXone (ROCEPHIN)  IV  1 g Intravenous Q24H  . enoxaparin (LOVENOX) injection  40 mg Subcutaneous Q24H  . rosuvastatin  20 mg Oral Daily   Continuous Infusions: . sodium chloride     PRN Meds:.acetaminophen, ibuprofen  Antibiotics  :    Anti-infectives    Start     Dose/Rate Route Frequency Ordered Stop   05/28/16 2000  cefTRIAXone (ROCEPHIN) 1 g in dextrose 5 % 50 mL IVPB     1 g 100 mL/hr over 30 Minutes Intravenous Every 24 hours 05/28/16 0555 06/04/16 1959   05/28/16 2000  azithromycin (ZITHROMAX) 500 mg in dextrose 5 % 250 mL IVPB     500 mg 250 mL/hr over 60 Minutes Intravenous Every 24 hours 05/28/16 0555 06/04/16 1959   05/27/16 1938  azithromycin (ZITHROMAX) 500 MG injection    Comments:  Anner Crete   : cabinet override      05/27/16 1938 05/28/16 0744    05/27/16 1930  cefTRIAXone (ROCEPHIN) 1 g in dextrose 5 % 50 mL IVPB     1 g 100 mL/hr over 30 Minutes Intravenous  Once 05/27/16 1923 05/27/16 2240   05/27/16 1930  azithromycin (ZITHROMAX) 500 mg in dextrose 5 % 250 mL IVPB     500 mg 250 mL/hr over 60 Minutes Intravenous  Once 05/27/16 1923 05/27/16 2143         Objective:   Filed Vitals:   05/28/16 0630 05/28/16 0650 05/28/16 0700 05/28/16 0800  BP:   136/54 110/62  Pulse: 56 74 64 64  Temp: 97.8 F (36.6 C)   98.8 F (37.1 C)  TempSrc: Oral   Oral  Resp: 26 22 17 13   Height:      Weight:      SpO2: 100% 99% 99% 94%    Wt Readings from Last 3 Encounters:  05/28/16 82 kg (180 lb 12.4 oz)  05/25/16 83.915 kg (185 lb)     Intake/Output Summary (Last 24 hours) at 05/28/16 0840 Last data filed at 05/28/16 0800  Gross per 24 hour  Intake 4361.25 ml  Output   1650 ml  Net 2711.25 ml     Physical Exam  Awake Alert, Oriented X 3, No new F.N deficits, Normal affect [place].AT,PERRAL Supple Neck,No JVD, No cervical lymphadenopathy appriciated.  Symmetrical Chest wall movement, Good air movement bilaterally, few R sided rales RRR,No Gallops,Rubs or new Murmurs, No Parasternal Heave +ve B.Sounds, Abd Soft, No tenderness, No organomegaly appriciated, No rebound - guarding or rigidity. No Cyanosis, Clubbing or edema,  No new Rash or bruise      Data Review:    CBC  Recent Labs Lab 05/27/16 1715  WBC 6.7  HGB 13.1  HCT 38.4*  PLT 290  MCV 96.5  MCH 32.9  MCHC 34.1  RDW 11.4*  LYMPHSABS 1.8  MONOABS 0.7  EOSABS 0.0  BASOSABS 0.0    Chemistries   Recent Labs Lab 05/27/16 1715  NA 136  K 3.8  CL 104  CO2 26  GLUCOSE 127*  BUN 22*  CREATININE 1.20  CALCIUM 8.3*  AST 38  ALT 36  ALKPHOS 30*  BILITOT 0.7   ------------------------------------------------------------------------------------------------------------------ No results for input(s): CHOL, HDL, LDLCALC, TRIG, CHOLHDL, LDLDIRECT in the  last 72 hours.  No results found for: HGBA1C ------------------------------------------------------------------------------------------------------------------  Recent Labs  05/28/16 0651  TSH 2.518   ------------------------------------------------------------------------------------------------------------------ No results for input(s): VITAMINB12, FOLATE, FERRITIN, TIBC, IRON, RETICCTPCT in the last 72 hours.  Coagulation profile No results for input(s): INR, PROTIME in the last 168 hours.  No results for input(s): DDIMER in the last 72 hours.  Cardiac Enzymes No results for input(s): CKMB, TROPONINI, MYOGLOBIN in the last 168 hours.  Invalid input(s): CK ------------------------------------------------------------------------------------------------------------------ No results found for: BNP  Micro Results Recent Results (from the past 240 hour(s))  Blood culture (routine x 2)     Status: None (Preliminary result)   Collection Time: 05/27/16  5:10 PM  Result Value Ref Range Status   Specimen Description BLOOD LEFT ANTECUBITAL  Final   Special Requests   Final    BOTTLES DRAWN AEROBIC AND ANAEROBIC 6CC Performed at [facility]    Culture PENDING  Incomplete   Report Status PENDING  Incomplete  Blood culture (routine x 2)     Status: None (Preliminary result)   Collection Time: 05/27/16  5:15 PM  Result Value Ref Range Status   Specimen Description BLOOD RIGHT ANTECUBITAL  Final   Special Requests   Final    BOTTLES DRAWN AEROBIC AND ANAEROBIC [place] Performed at [facility]    Culture PENDING  Incomplete   Report Status PENDING  Incomplete  MRSA PCR Screening     Status: None   Collection Time: 05/28/16  4:02 AM  Result Value Ref Range Status   MRSA by PCR NEGATIVE NEGATIVE Final    Comment:        The GeneXpert MRSA Assay (FDA approved for NASAL specimens only), is one component of a comprehensive MRSA colonization surveillance program. It is  not intended to diagnose MRSA infection nor to guide or monitor treatment for MRSA infections.     Radiology Reports Dg Chest 2 View  05/27/2016  CLINICAL DATA:  Fever. EXAM: CHEST  2 VIEW COMPARISON:  May 25, 2016 FINDINGS: Elevation of the left hemidiaphragm is again identified. Mild atelectasis seen in the left greater than right base. The heart, hila, and mediastinum are unchanged. No pulmonary nodules, masses, or focal infiltrates identified. IMPRESSION: Mild atelectasis in the left lung base. No other acute abnormalities. Electronically Signed   By: Dorise Bullion III M.D   On: 05/27/2016 18:17   Dg Chest 2 View  05/25/2016  CLINICAL DATA:  Cough for 1 week EXAM: CHEST  2 VIEW COMPARISON:  None. FINDINGS: No pneumothorax. There is elevation of the left hemidiaphragm. Mild atelectasis is seen in the left lung base. No pulmonary nodules or masses. No focal infiltrates. The heart, hila, and mediastinum are normal. IMPRESSION: No acute abnormality identified. Mild left basilar atelectasis. Mild elevation  of the left hemidiaphragm. Electronically Signed   By: Dorise Bullion III M.D   On: 05/25/2016 18:58   Ct Angio Chest Pe W/cm &/or Wo Cm  05/27/2016  CLINICAL DATA:  Hypoxia, fever, cough EXAM: CT ANGIOGRAPHY CHEST WITH CONTRAST TECHNIQUE: Multidetector CT imaging of the chest was performed using the standard protocol during bolus administration of intravenous contrast. Multiplanar CT image reconstructions and MIPs were obtained to evaluate the vascular anatomy. CONTRAST:  100 cc Isovue 370 COMPARISON:  05/27/2016 FINDINGS: Mediastinum/Nodes: No filling defects in the pulmonary arteries to suggest pulmonary emboli. Heart is upper limits normal in size. Aorta is normal caliber. No mediastinal, hilar, or axillary adenopathy. Scattered coronary artery calcifications. Lungs/Pleura: Bibasilar atelectasis. Patchy ground-glass opacities in the right upper lobe and superior segment of the right lower lobe  could reflect mild alveolitis/small airways disease. No effusion. Upper abdomen: Imaging into the upper abdomen shows no acute findings. Musculoskeletal: No acute bony abnormality or focal bone lesion. Review of the MIP images confirms the above findings. IMPRESSION: Bibasilar atelectasis. Patchy ground-glass opacities in the right upper lobe and superior segment of right lower lobe may reflect alveolitis/small airways disease. No evidence of pulmonary embolus. Coronary artery disease. Electronically Signed   By: Rolm Baptise M.D.   On: 05/27/2016 20:20    Time Spent in minutes  30   Carlis Burnsworth K M.D on 05/28/2016 at 8:40 AM  Between 7am to 7pm - Pager - [phone]  After 7pm go to [email or web address] - password [facility]  [facility] -  Office  [phone]

## 2016-05-29 ENCOUNTER — Inpatient Hospital Stay (HOSPITAL_COMMUNITY): Payer: Medicare Other

## 2016-05-29 DIAGNOSIS — I350 Nonrheumatic aortic (valve) stenosis: Secondary | ICD-10-CM

## 2016-05-29 LAB — BASIC METABOLIC PANEL
Anion gap: 5 (ref 5–15)
BUN: 12 mg/dL (ref 6–20)
CHLORIDE: 105 mmol/L (ref 101–111)
CO2: 27 mmol/L (ref 22–32)
Calcium: 8.1 mg/dL — ABNORMAL LOW (ref 8.9–10.3)
Creatinine, Ser: 1.11 mg/dL (ref 0.61–1.24)
GFR calc Af Amer: >60 mL/min (ref >60)
GFR calc non Af Amer: >60 mL/min (ref >60)
Glucose, Bld: 103 mg/dL — ABNORMAL HIGH (ref 65–99)
POTASSIUM: 3.9 mmol/L (ref 3.5–5.1)
SODIUM: 137 mmol/L (ref 135–145)

## 2016-05-29 LAB — CBC
HEMATOCRIT: 37.5 % — AB (ref 39.0–52.0)
HEMOGLOBIN: 12.6 g/dL — AB (ref 13.0–17.0)
MCH: 32.3 pg (ref 26.0–34.0)
MCHC: 33.6 g/dL (ref 30.0–36.0)
MCV: 96.2 fL (ref 78.0–100.0)
Platelets: 290 10*3/uL (ref 150–400)
RBC: 3.9 MIL/uL — AB (ref 4.22–5.81)
RDW: 12.2 % (ref 11.5–15.5)
WBC: 5.2 10*3/uL (ref 4.0–10.5)

## 2016-05-29 MED ORDER — ACETAMINOPHEN 325 MG PO TABS: 650.0000 mg | ORAL_TABLET | Freq: Four times a day (QID) | ORAL | Status: DC | PRN

## 2016-05-29 MED ORDER — POLYETHYLENE GLYCOL 3350 17 G PO PACK
17.0000 g | PACK | Freq: Two times a day (BID) | ORAL | Status: DC
  Administered 2016-05-29 – 2016-05-30 (×2): 17 g via ORAL
  Filled 2016-05-29 (×2): qty 1

## 2016-05-29 MED ORDER — DOCUSATE SODIUM 100 MG PO CAPS
200.0000 mg | ORAL_CAPSULE | Freq: Two times a day (BID) | ORAL | Status: DC
  Administered 2016-05-29: 200 mg via ORAL
  Filled 2016-05-29 (×2): qty 2

## 2016-05-29 MED ORDER — IBUPROFEN 200 MG PO TABS
600.0000 mg | ORAL_TABLET | Freq: Four times a day (QID) | ORAL | Status: DC | PRN
  Administered 2016-05-29 – 2016-05-30 (×3): 600 mg via ORAL
  Filled 2016-05-29 (×2): qty 3

## 2016-05-29 MED ORDER — FUROSEMIDE 10 MG/ML IJ SOLN
20.0000 mg | Freq: Once | INTRAMUSCULAR | Status: AC
  Administered 2016-05-29: 20 mg via INTRAVENOUS
  Filled 2016-05-29: qty 2

## 2016-05-29 NOTE — Progress Notes (Signed)
PROGRESS NOTE  Subjective:   82 yo with hx of bradycardia . Admitted with fever, pneumonia  Echo yesterday shows normal LV function. Mild aortic stenosis   Objective:    Vital Signs:   Temp:  [98.3 F (36.8 C)-100.8 F (38.2 C)] 98.7 F (37.1 C) (06/04 0800) Pulse Rate:  [53-74] 53 (06/04 0700) Resp:  [0-34] 30 (06/04 0700) BP: (98-150)/(38-96) 130/77 mmHg (06/04 0700) SpO2:  [93 %-99 %] 97 % (06/04 0700)  Last BM Date: 05/25/16   24-hour weight change: Weight change:   Weight trends: Filed Weights   05/27/16 1710 05/28/16 0355  Weight: 180 lb (81.647 kg) 180 lb 12.4 oz (82 kg)    Intake/Output:  06/03 0701 - 06/04 0700 In: 1508.3 [I.V.:1208.3; IV Piggyback:300] Out: 950 [Urine:950]     Physical Exam: BP 130/77 mmHg  Pulse 53  Temp(Src) 98.7 F (37.1 C) (Oral)  Resp 30  Ht 5\' 7"  (1.702 m)  Wt 180 lb 12.4 oz (82 kg)  BMI 28.31 kg/m2  SpO2 97%  Wt Readings from Last 3 Encounters:  05/28/16 180 lb 12.4 oz (82 kg)  05/25/16 185 lb (83.915 kg)    General: Vital signs reviewed and noted.   Head: Normocephalic, atraumatic.  Eyes: conjunctivae/corneas clear.  EOM's intact.   Throat: normal  Neck:  normal   Lungs:    clear   Heart:  RR, soft systolic murmur   Abdomen:  Soft, non-tender, non-distended    Extremities: No edema    Neurologic: A&O X3, CN II - XII are grossly intact.   Psych: Normal     Labs: BMET:  Recent Labs  05/27/16 1715 05/29/16 0316  NA 136 137  K 3.8 3.9  CL 104 105  CO2 26 27  GLUCOSE 127* 103*  BUN 22* 12  CREATININE 1.20 1.11  CALCIUM 8.3* 8.1*    Liver function tests:  Recent Labs  05/27/16 1715  AST 38  ALT 36  ALKPHOS 30*  BILITOT 0.7  PROT 6.5  ALBUMIN 3.4*   No results for input(s): LIPASE, AMYLASE in the last 72 hours.  CBC:  Recent Labs  05/27/16 1715 05/29/16 0316  WBC 6.7 5.2  NEUTROABS 4.3  --   HGB 13.1 12.6*  HCT 38.4* 37.5*  MCV 96.5 96.2  PLT 290 290    Cardiac  Enzymes: No results for input(s): CKTOTAL, CKMB, TROPONINI in the last 72 hours.  Coagulation Studies: No results for input(s): LABPROT, INR in the last 72 hours.  Other: Invalid input(s): POCBNP No results for input(s): DDIMER in the last 72 hours. No results for input(s): HGBA1C in the last 72 hours. No results for input(s): CHOL, HDL, LDLCALC, TRIG, CHOLHDL in the last 72 hours.  Recent Labs  05/28/16 0651  TSH 2.518   No results for input(s): VITAMINB12, FOLATE, FERRITIN, TIBC, IRON, RETICCTPCT in the last 72 hours.   Other results: Tele   ( personally reviewed )  -  NSR at 53   Medications:    Infusions:    Scheduled Medications: . aspirin  81 mg Oral Daily  . azithromycin  500 mg Intravenous Q24H  . cefTRIAXone (ROCEPHIN)  IV  1 g Intravenous Q24H  . enoxaparin (LOVENOX) injection  40 mg Subcutaneous Q24H  . furosemide  20 mg Intravenous Once  . rosuvastatin  20 mg Oral Daily    Assessment/ Plan:   Principal Problem:   CAP (community acquired pneumonia) Active Problems:   Mobitz type  1 second degree atrioventricular block  1. Bradycardia:   Seems to have resolved.  Continue to avoid AV nodal blocking meds  2. Aortic stenosis;   Mild I've given wife a copy of the echo to give to his  medical doctor   No additional cardiac work up needed Will sign off.  Call for questins  Disposition:  Length of Stay: 2  Ramond Dial., MD, [facility] 05/29/2016, 8:33 AM Office [phone] Pager [phone]

## 2016-05-29 NOTE — Evaluation (Signed)
Physical Therapy Evaluation Patient Details Name: Jake Acevedo MRN: [id number] DOB: 1933/09/12 Today's Date: 05/29/2016   History of Present Illness  82 yo male admitted  through ED 05/27/16 with cough fever, Work up in ED demonstrated RUL and RLL PNA on CT chest.  Clinical Impression  The patient is very quiet, up ad lib in the room. Hi  Oxygen saturation was 100% after ambulating 400' with  No  AD and HR 70. Pt admitted with above diagnosis. Pt currently with functional limitations due to the deficits listed below (see PT Problem List).  Pt will benefit from skilled PT to increase their independence and safety with mobility to allow discharge to home.     Follow Up Recommendations No PT follow up    Equipment Recommendations  None recommended by PT    Recommendations for Other Services       Precautions / Restrictions Precautions Precaution Comments: check sats      Mobility  Bed Mobility                  Transfers                 General transfer comment: part was coming from bathroom when PT arrived.  Ambulation/Gait Ambulation/Gait assistance: Supervision Ambulation Distance (Feet): 400 Feet Assistive device: None Gait Pattern/deviations: WFL(Within Functional Limits)     General Gait Details: no balance losses, steady gait. slow speed.  Stairs            Wheelchair Mobility    Modified Rankin (Stroke Patients Only)       Balance Overall balance assessment: Needs assistance         Standing balance support: During functional activity;No upper extremity supported Standing balance-Leahy Scale: Good                               Pertinent Vitals/Pain Pain Assessment: No/denies pain    Home Living Family/patient expects to be discharged to:: Private residence Living Arrangements: Spouse/significant other Available Help at Discharge: Family Type of Home: House Home Access: Stairs to enter   Technical brewer of  Steps: o Home Layout: Two level Home Equipment: None      Prior Function Level of Independence: Independent         Comments: limited in activity, drives     Hand Dominance        Extremity/Trunk Assessment   Upper Extremity Assessment: Overall WFL for tasks assessed           Lower Extremity Assessment: Overall WFL for tasks assessed      Cervical / Trunk Assessment: Normal  Communication   Communication: No difficulties  Cognition Arousal/Alertness: Awake/alert Behavior During Therapy: Flat affect Overall Cognitive Status: Within Functional Limits for tasks assessed                      General Comments      Exercises        Assessment/Plan    PT Assessment Patient needs continued PT services  PT Diagnosis Generalized weakness   PT Problem List Decreased activity tolerance;Decreased mobility  PT Treatment Interventions Gait training;Functional mobility training;Patient/family education   PT Goals (Current goals can be found in the Care Plan section) Acute Rehab PT Goals Patient Stated Goal: to go home PT Goal Formulation: With patient/family Time For Goal Achievement: 06/05/16 Potential to Achieve Goals: Good    Frequency Min 3X/week  Barriers to discharge        Co-evaluation               End of Session   Activity Tolerance: Patient tolerated treatment well Patient left: in chair;with call bell/phone within reach;with family/visitor present Nurse Communication: Mobility status         Time: 1145-1200 PT Time Calculation (min) (ACUTE ONLY): 15 min   Charges:   PT Evaluation $PT Eval Low Complexity: 1 Procedure     PT G CodesClaretha Cooper 05/29/2016, 12:10 PM

## 2016-05-29 NOTE — Progress Notes (Signed)
[facility], NP at pt bedside to evaluate rash on pt left side.  No further orders at this time, continue to monitor.

## 2016-05-29 NOTE — Progress Notes (Signed)
RT came to bedside in the AM to demonstrate flutter valve to patient. Patient understood and demonstrated proper technique for using flutter.

## 2016-05-29 NOTE — Progress Notes (Addendum)
PROGRESS NOTE                                                                                                                                                                                                             Patient Demographics:    Jake Acevedo, is a 82 y.o. male, DOB - 23-Jul-1933, [id number]  Admit date - 05/27/2016   Admitting Physician Etta Quill, DO  Outpatient Primary MD for the patient is No PCP Per Patient  LOS - 2  Chief Complaint  Patient presents with  . Fever       Brief Narrative    Jake Acevedo is a 82 y.o. male with medical history significant of prostate cancer, patient presents to the ED at [facility] with c/o cough, fever as high as 104 at home this evening. Pt reports onset of cough and nasal congestion 1 week ago. He state that his cough is productive of thick phlegm and he feels congested in his chest. Denies associated chest pain or wheezing. Notes some mild SOB when he takes deep breaths but no SOB on exertion. He also notes associated body aches, fatigue, malaise and nausea. Denies vomiting or abdominal pain. He had called his PCP who wrote him an rx for for amoxicillin. He has taken this for 3 days with some moderate improvement in symptoms. He states that this morning his fever spiked to 105 degrees. He called his PCP who called him in a prescription for levaquin but he has not filled this. He was seen in ED two days ago for same complaints and diagnosed with viral URI. He denise new symptoms since previous ED visit. He is a former smoker. Denies lung hx.  ED Course: Work up in ED demonstrated RUL and RLL PNA on CT chest, started on rocephin and azithromycin and given tylenol / motrin for fever. He was initially borderline hypotensive but after 3L IVF his BP has significantly improved to 148/62 by the time he arrives to [facility].   Subjective:    [name] today has, No headache, No chest pain,  No abdominal pain - No Nausea, No new weakness tingling or numbness, Positive cough but improved shortness of breath.   Assessment  & Plan :    1.CAP. Symptoms ongoing for at least 4-5 days before admission, fever of up to 103  initially but now improving, CT chest suggestive of pneumonia, surprisingly he does not have leukocytosis, -ve influenza PCR, for now continue empiric antibiotics which are Rocephin and Azithromycin, continue supportive care with nebulizer treatments. He is off o2, added flutter valve, encouraged to sit up in the chair, follow cultures. Increase activity if stable DC home in am.   2. Question Wenckebach on initial EKG. Cardiology has been consulted, avoiding rate controlling agents. TSH & TTE stable, no further workup per Cards, per Cards could be 1st degree block.   3. Dyslipidemia. Continue home dose statin.   4. Chr. Daist.Grade 1 CHF EF 60% - compensated     Code Status :  Full  Family Communication  : Wife bedside  Disposition Plan  : Home in 1-2 days for now stay and stepdown  Consults  :  Cards  Procedures  :     CT angiogram. Right upper lobe opacity.  DVT Prophylaxis  :  Lovenox    Lab Results  Component Value Date   PLT 290 05/29/2016    Inpatient Medications  Scheduled Meds: . aspirin  81 mg Oral Daily  . azithromycin  500 mg Intravenous Q24H  . cefTRIAXone (ROCEPHIN)  IV  1 g Intravenous Q24H  . enoxaparin (LOVENOX) injection  40 mg Subcutaneous Q24H  . rosuvastatin  20 mg Oral Daily   Continuous Infusions: . sodium chloride 50 mL/hr at 05/28/16 1000   PRN Meds:.acetaminophen, ibuprofen  Antibiotics  :    Anti-infectives    Start     Dose/Rate Route Frequency Ordered Stop   05/28/16 2000  cefTRIAXone (ROCEPHIN) 1 g in dextrose 5 % 50 mL IVPB     1 g 100 mL/hr over 30 Minutes Intravenous Every 24 hours 05/28/16 0555 06/04/16 1959   05/28/16 2000  azithromycin (ZITHROMAX) 500 mg in dextrose 5 % 250 mL IVPB     500 mg 250  mL/hr over 60 Minutes Intravenous Every 24 hours 05/28/16 0555 06/04/16 1959   05/27/16 1938  azithromycin (ZITHROMAX) 500 MG injection    Comments:  Anner Crete   : cabinet override      05/27/16 1938 05/28/16 0744   05/27/16 1930  cefTRIAXone (ROCEPHIN) 1 g in dextrose 5 % 50 mL IVPB     1 g 100 mL/hr over 30 Minutes Intravenous  Once 05/27/16 1923 05/27/16 2240   05/27/16 1930  azithromycin (ZITHROMAX) 500 mg in dextrose 5 % 250 mL IVPB     500 mg 250 mL/hr over 60 Minutes Intravenous  Once 05/27/16 1923 05/27/16 2143         Objective:   Filed Vitals:   05/29/16 0400 05/29/16 0500 05/29/16 0600 05/29/16 0700  BP: 137/72 130/59 129/58 130/77  Pulse: 58 71 59 53  Temp: 100.8 F (38.2 C)     TempSrc: Oral     Resp: 0 27 27 30   Height:      Weight:      SpO2: 97% 95% 93% 97%    Wt Readings from Last 3 Encounters:  05/28/16 82 kg (180 lb 12.4 oz)  05/25/16 83.915 kg (185 lb)     Intake/Output Summary (Last 24 hours) at 05/29/16 0758 Last data filed at 05/29/16 0600  Gross per 24 hour  Intake 1508.33 ml  Output    950 ml  Net 558.33 ml     Physical Exam  Awake Alert, Oriented X 3, No new F.N deficits, Normal affect [place].AT,PERRAL Supple Neck,No JVD, No cervical lymphadenopathy  appriciated.  Symmetrical Chest wall movement, Good air movement bilaterally, few R sided rales RRR,No Gallops,Rubs or new Murmurs, No Parasternal Heave +ve B.Sounds, Abd Soft, No tenderness, No organomegaly appriciated, No rebound - guarding or rigidity. No Cyanosis, Clubbing or edema, No new Rash or bruise      Data Review:    CBC  Recent Labs Lab 05/27/16 1715 05/29/16 0316  WBC 6.7 5.2  HGB 13.1 12.6*  HCT 38.4* 37.5*  PLT 290 290  MCV 96.5 96.2  MCH 32.9 32.3  MCHC 34.1 33.6  RDW 11.4* 12.2  LYMPHSABS 1.8  --   MONOABS 0.7  --   EOSABS 0.0  --   BASOSABS 0.0  --     Chemistries   Recent Labs Lab 05/27/16 1715 05/29/16 0316  NA 136 137  K 3.8 3.9  CL 104  105  CO2 26 27  GLUCOSE 127* 103*  BUN 22* 12  CREATININE 1.20 1.11  CALCIUM 8.3* 8.1*  AST 38  --   ALT 36  --   ALKPHOS 30*  --   BILITOT 0.7  --    ------------------------------------------------------------------------------------------------------------------ No results for input(s): CHOL, HDL, LDLCALC, TRIG, CHOLHDL, LDLDIRECT in the last 72 hours.  No results found for: HGBA1C ------------------------------------------------------------------------------------------------------------------  Recent Labs  05/28/16 0651  TSH 2.518   ------------------------------------------------------------------------------------------------------------------ No results for input(s): VITAMINB12, FOLATE, FERRITIN, TIBC, IRON, RETICCTPCT in the last 72 hours.  Coagulation profile No results for input(s): INR, PROTIME in the last 168 hours.  No results for input(s): DDIMER in the last 72 hours.  Cardiac Enzymes No results for input(s): CKMB, TROPONINI, MYOGLOBIN in the last 168 hours.  Invalid input(s): CK ------------------------------------------------------------------------------------------------------------------ No results found for: BNP  Micro Results Recent Results (from the past 240 hour(s))  Blood culture (routine x 2)     Status: None (Preliminary result)   Collection Time: 05/27/16  5:10 PM  Result Value Ref Range Status   Specimen Description BLOOD LEFT ANTECUBITAL  Final   Special Requests   Final    BOTTLES DRAWN AEROBIC AND ANAEROBIC 6CC Performed at [facility]    Culture PENDING  Incomplete   Report Status PENDING  Incomplete  Blood culture (routine x 2)     Status: None (Preliminary result)   Collection Time: 05/27/16  5:15 PM  Result Value Ref Range Status   Specimen Description BLOOD RIGHT ANTECUBITAL  Final   Special Requests   Final    BOTTLES DRAWN AEROBIC AND ANAEROBIC [place] Performed at [facility]    Culture PENDING  Incomplete    Report Status PENDING  Incomplete  MRSA PCR Screening     Status: None   Collection Time: 05/28/16  4:02 AM  Result Value Ref Range Status   MRSA by PCR NEGATIVE NEGATIVE Final    Comment:        The GeneXpert MRSA Assay (FDA approved for NASAL specimens only), is one component of a comprehensive MRSA colonization surveillance program. It is not intended to diagnose MRSA infection nor to guide or monitor treatment for MRSA infections.   Culture, respiratory (NON-Expectorated)     Status: None (Preliminary result)   Collection Time: 05/28/16  3:30 PM  Result Value Ref Range Status   Specimen Description SPUTUM  Final   Special Requests NONE  Final   Gram Stain   Final    ABUNDANT WBC PRESENT,BOTH PMN AND MONONUCLEAR FEW GRAM POSITIVE COCCI IN CHAINS IN CLUSTERS IN PAIRS RARE GRAM NEGATIVE RODS  GRAM STAIN REVIEWED-AGREE WITH RESULT V WILKINS    Culture PENDING  Incomplete   Report Status PENDING  Incomplete  Culture, sputum-assessment     Status: None   Collection Time: 05/28/16  3:34 PM  Result Value Ref Range Status   Specimen Description SPUTUM  Final   Special Requests NONE  Final   Sputum evaluation   Final    THIS SPECIMEN IS ACCEPTABLE. RESPIRATORY CULTURE REPORT TO FOLLOW.   Report Status 05/28/2016 FINAL  Final    Radiology Reports Dg Chest 2 View  05/27/2016  CLINICAL DATA:  Fever. EXAM: CHEST  2 VIEW COMPARISON:  May 25, 2016 FINDINGS: Elevation of the left hemidiaphragm is again identified. Mild atelectasis seen in the left greater than right base. The heart, hila, and mediastinum are unchanged. No pulmonary nodules, masses, or focal infiltrates identified. IMPRESSION: Mild atelectasis in the left lung base. No other acute abnormalities. Electronically Signed   By: Dorise Bullion III M.D   On: 05/27/2016 18:17   Dg Chest 2 View  05/25/2016  CLINICAL DATA:  Cough for 1 week EXAM: CHEST  2 VIEW COMPARISON:  None. FINDINGS: No pneumothorax. There is elevation of the  left hemidiaphragm. Mild atelectasis is seen in the left lung base. No pulmonary nodules or masses. No focal infiltrates. The heart, hila, and mediastinum are normal. IMPRESSION: No acute abnormality identified. Mild left basilar atelectasis. Mild elevation of the left hemidiaphragm. Electronically Signed   By: Dorise Bullion III M.D   On: 05/25/2016 18:58   Ct Angio Chest Pe W/cm &/or Wo Cm  05/27/2016  CLINICAL DATA:  Hypoxia, fever, cough EXAM: CT ANGIOGRAPHY CHEST WITH CONTRAST TECHNIQUE: Multidetector CT imaging of the chest was performed using the standard protocol during bolus administration of intravenous contrast. Multiplanar CT image reconstructions and MIPs were obtained to evaluate the vascular anatomy. CONTRAST:  100 cc Isovue 370 COMPARISON:  05/27/2016 FINDINGS: Mediastinum/Nodes: No filling defects in the pulmonary arteries to suggest pulmonary emboli. Heart is upper limits normal in size. Aorta is normal caliber. No mediastinal, hilar, or axillary adenopathy. Scattered coronary artery calcifications. Lungs/Pleura: Bibasilar atelectasis. Patchy ground-glass opacities in the right upper lobe and superior segment of the right lower lobe could reflect mild alveolitis/small airways disease. No effusion. Upper abdomen: Imaging into the upper abdomen shows no acute findings. Musculoskeletal: No acute bony abnormality or focal bone lesion. Review of the MIP images confirms the above findings. IMPRESSION: Bibasilar atelectasis. Patchy ground-glass opacities in the right upper lobe and superior segment of right lower lobe may reflect alveolitis/small airways disease. No evidence of pulmonary embolus. Coronary artery disease. Electronically Signed   By: Rolm Baptise M.D.   On: 05/27/2016 20:20   Dg Chest Port 1 View  05/29/2016  CLINICAL DATA:  Shortness of Breath EXAM: PORTABLE CHEST 1 VIEW COMPARISON:  05/27/2016 FINDINGS: Left basilar airspace opacity noted which could reflect atelectasis or  infiltrate. Heart is borderline in size. Minimal right base atelectasis. No visible effusions or acute bony abnormality. IMPRESSION: Increasing left basilar atelectasis or infiltrate. Minimal right base atelectasis. Electronically Signed   By: Rolm Baptise M.D.   On: 05/29/2016 07:09    Time Spent in minutes  30   Lynzee Lindquist K M.D on 05/29/2016 at 7:58 AM  Between 7am to 7pm - Pager - [phone]  After 7pm go to [email or web address] - password [facility]  [facility] -  Office  [phone]

## 2016-05-29 NOTE — Progress Notes (Signed)
Received pt from ICU, VS obtained, oriented to unit, call light placed in reach

## 2016-05-30 ENCOUNTER — Inpatient Hospital Stay (HOSPITAL_COMMUNITY): Payer: Medicare Other

## 2016-05-30 LAB — BASIC METABOLIC PANEL
ANION GAP: 6 (ref 5–15)
BUN: 14 mg/dL (ref 6–20)
CALCIUM: 8.3 mg/dL — AB (ref 8.9–10.3)
CO2: 28 mmol/L (ref 22–32)
CREATININE: 1.15 mg/dL (ref 0.61–1.24)
Chloride: 103 mmol/L (ref 101–111)
GFR calc non Af Amer: 58 mL/min — ABNORMAL LOW (ref >60)
Glucose, Bld: 99 mg/dL (ref 65–99)
Potassium: 3.7 mmol/L (ref 3.5–5.1)
SODIUM: 137 mmol/L (ref 135–145)

## 2016-05-30 LAB — CBC
HEMATOCRIT: 35.3 % — AB (ref 39.0–52.0)
HEMOGLOBIN: 12.4 g/dL — AB (ref 13.0–17.0)
MCH: 32.4 pg (ref 26.0–34.0)
MCHC: 35.1 g/dL (ref 30.0–36.0)
MCV: 92.2 fL (ref 78.0–100.0)
Platelets: 282 10*3/uL (ref 150–400)
RBC: 3.83 MIL/uL — ABNORMAL LOW (ref 4.22–5.81)
RDW: 11.8 % (ref 11.5–15.5)
WBC: 4.9 10*3/uL (ref 4.0–10.5)

## 2016-05-30 MED ORDER — CEFPODOXIME PROXETIL 200 MG PO TABS: 200.0000 mg | ORAL_TABLET | Freq: Two times a day (BID) | ORAL | Status: AC

## 2016-05-30 MED ORDER — DEXTROMETHORPHAN-GUAIFENESIN 10-100 MG/5ML PO LIQD: 10.0000 mL | ORAL | Status: AC | PRN

## 2016-05-30 MED ORDER — AZITHROMYCIN 500 MG PO TABS: 500.0000 mg | ORAL_TABLET | Freq: Every day | ORAL | Status: AC

## 2016-05-30 MED ORDER — POLYETHYLENE GLYCOL 3350 17 G PO PACK: 17.0000 g | PACK | Freq: Two times a day (BID) | ORAL | Status: AC

## 2016-05-30 MED ORDER — SACCHAROMYCES BOULARDII 250 MG PO CAPS: 250.0000 mg | ORAL_CAPSULE | Freq: Two times a day (BID) | ORAL | Status: AC

## 2016-05-30 NOTE — Discharge Instructions (Signed)
Follow with Primary MD or an Urgent Care in 7 days   Get CBC, CMP, 2 view Chest X ray checked  by Primary MD next visit.    Activity: As tolerated with Full fall precautions use walker/cane & assistance as needed   Disposition Home    Diet:   Heart Healthy  .  For Heart failure patients - Check your Weight same time everyday, if you gain over 2 pounds, or you develop in leg swelling, experience more shortness of breath or chest pain, call your Primary MD immediately. Follow Cardiac Low Salt Diet and 1.5 lit/day fluid restriction.   On your next visit with your primary care physician please Get Medicines reviewed and adjusted.   Please request your Prim.MD to go over all Hospital Tests and Procedure/Radiological results at the follow up, please get all Hospital records sent to your Prim MD by signing hospital release before you go home.   If you experience worsening of your admission symptoms, develop shortness of breath, life threatening emergency, suicidal or homicidal thoughts you must seek medical attention immediately by calling 911 or calling your MD immediately  if symptoms less severe.  You Must read complete instructions/literature along with all the possible adverse reactions/side effects for all the Medicines you take and that have been prescribed to you. Take any new Medicines after you have completely understood and accpet all the possible adverse reactions/side effects.   Do not drive, operate heavy machinery, perform activities at heights, swimming or participation in water activities or provide baby sitting services if your were admitted for syncope or siezures until you have seen by Primary MD or a Neurologist and advised to do so again.  Do not drive when taking Pain medications.    Do not take more than prescribed Pain, Sleep and Anxiety Medications  Special Instructions: If you have smoked or chewed Tobacco  in the last 2 yrs please stop smoking, stop any regular  Alcohol  and or any Recreational drug use.  Wear Seat belts while driving.   Please note  You were cared for by a hospitalist during your hospital stay. If you have any questions about your discharge medications or the care you received while you were in the hospital after you are discharged, you can call the unit and asked to speak with the hospitalist on call if the hospitalist that took care of you is not available. Once you are discharged, your primary care physician will handle any further medical issues. Please note that NO REFILLS for any discharge medications will be authorized once you are discharged, as it is imperative that you return to your primary care physician (or establish a relationship with a primary care physician if you do not have one) for your aftercare needs so that they can reassess your need for medications and monitor your lab values.

## 2016-05-30 NOTE — Discharge Summary (Signed)
Jake Acevedo, is a 82 y.o. male  DOB Jul 27, 1933  MRN [id number].  Admission date:  05/27/2016  Admitting Physician  Etta Quill, DO  Discharge Date:  05/30/2016   Primary MD  No PCP Per Patient  Recommendations for primary care physician for things to follow:   CBC, BMP and a 2 view chest x-ray to be checked in a week to 10 days' time.   Admission Diagnosis  CAP (community acquired pneumonia) [J18.9]   Discharge Diagnosis  CAP (community acquired pneumonia) [J18.9]    Principal Problem:   CAP (community acquired pneumonia) Active Problems:   Mobitz type 1 second degree atrioventricular block   Aortic stenosis      Past Medical History  Diagnosis Date  . Prostate cancer ([place])   . Pneumonia     History reviewed. No pertinent past surgical history.     HPI  from the history and physical done on the day of admission:     Jake Acevedo is a 82 y.o. male with medical history significant of prostate cancer, patient presents to the ED at [facility] with c/o cough, fever as high as 104 at home this evening. Pt reports onset of cough and nasal congestion 1 week ago. He state that his cough is productive of thick phlegm and he feels congested in his chest. Denies associated chest pain or wheezing. Notes some mild SOB when he takes deep breaths but no SOB on exertion. He also notes associated body aches, fatigue, malaise and nausea. Denies vomiting or abdominal pain. He had called his PCP who wrote him an rx for for amoxicillin. He has taken this for 3 days with some moderate improvement in symptoms. He states that this morning his fever spiked to 105 degrees. He called his PCP who called him in a prescription for levaquin but he has not filled this. He was seen in ED two days ago for same complaints and diagnosed with viral URI.  He denise new symptoms since previous ED visit. He is a former smoker. Denies lung hx.  ED Course: Work up in ED demonstrated RUL and RLL PNA on CT chest, started on rocephin and azithromycin and given tylenol / motrin for fever. He was initially borderline hypotensive but after 3L IVF his BP has significantly improved to 148/62 by the time he arrives to [facility] SDU.    Hospital Course:    1.CAP. Symptoms ongoing for at least 4-5 days before admission, fever of up to 103 initially but now improving, CT chest suggestive of pneumonia, surprisingly he does not have leukocytosis, -ve influenza PCR, Responded very well to empiric IV Rocephin and azithromycin, came off of oxygen on 05/29/2016, no temperatures in the last 2 days, no leukocytosis, will be placed on 5 more days of oral Vantin and azithromycin and discharged home. Request PCP to check CBC, BMP and a 2 view chest x-ray in 7-10 days.  2. Question Wenckebach on initial EKG. Cardiology has been consulted, avoiding rate controlling agents. TSH & TTE stable, no  further workup per Cards, per Cards could be 1st degree block. Follow outpatient with primary cardiologist in [place].  3. Dyslipidemia. Continue home dose statin.  4. Chr. Daist.Grade 1 CHF EF 60% - compensated, please read the echo report.    Follow UP  Follow-up Information    Follow up with Your PCP or nearest urgent care. Schedule an appointment as soon as possible for a visit in 1 week.       Consults obtained - None  Discharge Condition: Stable  Diet and Activity recommendation: See Discharge Instructions below  Discharge Instructions       Discharge Instructions    Diet - low sodium heart healthy    Complete by:  As directed      Discharge instructions    Complete by:  As directed   Follow with Primary MD or an Urgent Care in 7 days   Get CBC, CMP, 2 view Chest X ray checked  by Primary MD next visit.    Activity: As tolerated with Full fall precautions use  walker/cane & assistance as needed   Disposition Home    Diet:   Heart Healthy  .  For Heart failure patients - Check your Weight same time everyday, if you gain over 2 pounds, or you develop in leg swelling, experience more shortness of breath or chest pain, call your Primary MD immediately. Follow Cardiac Low Salt Diet and 1.5 lit/day fluid restriction.   On your next visit with your primary care physician please Get Medicines reviewed and adjusted.   Please request your Prim.MD to go over all Hospital Tests and Procedure/Radiological results at the follow up, please get all Hospital records sent to your Prim MD by signing hospital release before you go home.   If you experience worsening of your admission symptoms, develop shortness of breath, life threatening emergency, suicidal or homicidal thoughts you must seek medical attention immediately by calling 911 or calling your MD immediately  if symptoms less severe.  You Must read complete instructions/literature along with all the possible adverse reactions/side effects for all the Medicines you take and that have been prescribed to you. Take any new Medicines after you have completely understood and accpet all the possible adverse reactions/side effects.   Do not drive, operate heavy machinery, perform activities at heights, swimming or participation in water activities or provide baby sitting services if your were admitted for syncope or siezures until you have seen by Primary MD or a Neurologist and advised to do so again.  Do not drive when taking Pain medications.    Do not take more than prescribed Pain, Sleep and Anxiety Medications  Special Instructions: If you have smoked or chewed Tobacco  in the last 2 yrs please stop smoking, stop any regular Alcohol  and or any Recreational drug use.  Wear Seat belts while driving.   Please note  You were cared for by a hospitalist during your hospital stay. If you have any  questions about your discharge medications or the care you received while you were in the hospital after you are discharged, you can call the unit and asked to speak with the hospitalist on call if the hospitalist that took care of you is not available. Once you are discharged, your primary care physician will handle any further medical issues. Please note that NO REFILLS for any discharge medications will be authorized once you are discharged, as it is imperative that you return to your primary care physician (or  establish a relationship with a primary care physician if you do not have one) for your aftercare needs so that they can reassess your need for medications and monitor your lab values.     Increase activity slowly    Complete by:  As directed              Discharge Medications       Medication List    STOP taking these medications        amoxicillin 500 MG capsule  Commonly known as:  AMOXIL      TAKE these medications        aspirin 81 MG tablet  Take 81 mg by mouth daily.     azithromycin 500 MG tablet  Commonly known as:  ZITHROMAX  Take 1 tablet (500 mg total) by mouth daily.     cefpodoxime 200 MG tablet  Commonly known as:  VANTIN  Take 1 tablet (200 mg total) by mouth 2 (two) times daily.     co-enzyme Q-10 30 MG capsule  Take 30 mg by mouth daily. Reported on 05/28/2016     dextromethorphan-guaiFENesin 10-100 MG/5ML liquid  Commonly known as:  TUSSIN DM  Take 10 mLs by mouth every 4 (four) hours as needed for cough.     polyethylene glycol packet  Commonly known as:  MIRALAX / GLYCOLAX  Take 17 g by mouth 2 (two) times daily.     rosuvastatin 20 MG tablet  Commonly known as:  CRESTOR  Take 20 mg by mouth daily.     saccharomyces boulardii 250 MG capsule  Commonly known as:  FLORASTOR  Take 1 capsule (250 mg total) by mouth 2 (two) times daily.     Vitamin D-3 1000 units Caps  Take 1,000 Units by mouth daily.        Major procedures and  Radiology Reports - PLEASE review detailed and final reports for all details, in brief -   TTE  - Left ventricle: The cavity size was normal. There was moderate concentric hypertrophy. Systolic function was vigorous. The estimated ejection fraction was in the range of 65% to 70%. Wall motion was normal; there were no regional wall motion abnormalities. There was an increased relative contribution of atrial contraction to ventricular filling. Doppler parameters are consistent with abnormal left ventricular relaxation (grade 1 diastolic dysfunction). - Aortic valve: Moderate diffuse calcification involving the right coronary and noncoronary cusp. There was very mild stenosis. There was mild regurgitation. Valve area (VTI): 2.3 cm^2. Valve area (Vmax): 2.27 cm^2. Valve area (Vmean): 2.2 cm^2. - Mitral valve: There was trivial regurgitation. - Pulmonic valve: There was trivial regurgitation.  Dg Chest 2 View  05/30/2016  CLINICAL DATA:  Followup community acquired pneumonia EXAM: CHEST  2 VIEW COMPARISON:  Yesterday FINDINGS: Unchanged left basilar opacity with volume loss, presumed combination of pneumonia and atelectasis. No edema. Possible trace effusions. No pneumothorax. Normal heart size and mediastinal contours. IMPRESSION: History of pneumonia with stable left basilar opacity that is partly atelectatic. Electronically Signed   By: Monte Fantasia M.D.   On: 05/30/2016 07:03   Dg Chest 2 View  05/27/2016  CLINICAL DATA:  Fever. EXAM: CHEST  2 VIEW COMPARISON:  May 25, 2016 FINDINGS: Elevation of the left hemidiaphragm is again identified. Mild atelectasis seen in the left greater than right base. The heart, hila, and mediastinum are unchanged. No pulmonary nodules, masses, or focal infiltrates identified. IMPRESSION: Mild atelectasis in the left lung base. No other acute  abnormalities. Electronically Signed   By: Dorise Bullion III M.D   On: 05/27/2016 18:17   Dg Chest 2 View  05/25/2016  CLINICAL  DATA:  Cough for 1 week EXAM: CHEST  2 VIEW COMPARISON:  None. FINDINGS: No pneumothorax. There is elevation of the left hemidiaphragm. Mild atelectasis is seen in the left lung base. No pulmonary nodules or masses. No focal infiltrates. The heart, hila, and mediastinum are normal. IMPRESSION: No acute abnormality identified. Mild left basilar atelectasis. Mild elevation of the left hemidiaphragm. Electronically Signed   By: Dorise Bullion III M.D   On: 05/25/2016 18:58   Ct Angio Chest Pe W/cm &/or Wo Cm  05/27/2016  CLINICAL DATA:  Hypoxia, fever, cough EXAM: CT ANGIOGRAPHY CHEST WITH CONTRAST TECHNIQUE: Multidetector CT imaging of the chest was performed using the standard protocol during bolus administration of intravenous contrast. Multiplanar CT image reconstructions and MIPs were obtained to evaluate the vascular anatomy. CONTRAST:  100 cc Isovue 370 COMPARISON:  05/27/2016 FINDINGS: Mediastinum/Nodes: No filling defects in the pulmonary arteries to suggest pulmonary emboli. Heart is upper limits normal in size. Aorta is normal caliber. No mediastinal, hilar, or axillary adenopathy. Scattered coronary artery calcifications. Lungs/Pleura: Bibasilar atelectasis. Patchy ground-glass opacities in the right upper lobe and superior segment of the right lower lobe could reflect mild alveolitis/small airways disease. No effusion. Upper abdomen: Imaging into the upper abdomen shows no acute findings. Musculoskeletal: No acute bony abnormality or focal bone lesion. Review of the MIP images confirms the above findings. IMPRESSION: Bibasilar atelectasis. Patchy ground-glass opacities in the right upper lobe and superior segment of right lower lobe may reflect alveolitis/small airways disease. No evidence of pulmonary embolus. Coronary artery disease. Electronically Signed   By: Rolm Baptise M.D.   On: 05/27/2016 20:20   Dg Chest Port 1 View  05/29/2016  CLINICAL DATA:  Shortness of Breath EXAM: PORTABLE CHEST 1 VIEW  COMPARISON:  05/27/2016 FINDINGS: Left basilar airspace opacity noted which could reflect atelectasis or infiltrate. Heart is borderline in size. Minimal right base atelectasis. No visible effusions or acute bony abnormality. IMPRESSION: Increasing left basilar atelectasis or infiltrate. Minimal right base atelectasis. Electronically Signed   By: Rolm Baptise M.D.   On: 05/29/2016 07:09    Micro Results      Recent Results (from the past 240 hour(s))  Blood culture (routine x 2)     Status: None (Preliminary result)   Collection Time: 05/27/16  5:10 PM  Result Value Ref Range Status   Specimen Description BLOOD LEFT ANTECUBITAL  Final   Special Requests BOTTLES DRAWN AEROBIC AND ANAEROBIC 6CC  Final   Culture   Final    NO GROWTH 1 DAY Performed at [facility]    Report Status PENDING  Incomplete  Blood culture (routine x 2)     Status: None (Preliminary result)   Collection Time: 05/27/16  5:15 PM  Result Value Ref Range Status   Specimen Description BLOOD RIGHT ANTECUBITAL  Final   Special Requests   Final    BOTTLES DRAWN AEROBIC AND ANAEROBIC [place] Performed at [facility]    Culture PENDING  Incomplete   Report Status PENDING  Incomplete  MRSA PCR Screening     Status: None   Collection Time: 05/28/16  4:02 AM  Result Value Ref Range Status   MRSA by PCR NEGATIVE NEGATIVE Final    Comment:        The GeneXpert MRSA Assay (FDA approved for NASAL specimens  only), is one component of a comprehensive MRSA colonization surveillance program. It is not intended to diagnose MRSA infection nor to guide or monitor treatment for MRSA infections.   Culture, respiratory (NON-Expectorated)     Status: None (Preliminary result)   Collection Time: 05/28/16  3:30 PM  Result Value Ref Range Status   Specimen Description SPUTUM  Final   Special Requests NONE  Final   Gram Stain   Final    ABUNDANT WBC PRESENT,BOTH PMN AND MONONUCLEAR FEW GRAM POSITIVE COCCI IN CHAINS  IN CLUSTERS IN PAIRS RARE GRAM NEGATIVE RODS GRAM STAIN REVIEWED-AGREE WITH RESULT V WILKINS    Culture   Final    TOO YOUNG TO READ Performed at [facility]    Report Status PENDING  Incomplete  Culture, sputum-assessment     Status: None   Collection Time: 05/28/16  3:34 PM  Result Value Ref Range Status   Specimen Description SPUTUM  Final   Special Requests NONE  Final   Sputum evaluation   Final    THIS SPECIMEN IS ACCEPTABLE. RESPIRATORY CULTURE REPORT TO FOLLOW.   Report Status 05/28/2016 FINAL  Final    Today   Subjective    Hale Paumen today has no headache,no chest abdominal pain,no new weakness tingling or numbness, feels much better wants to go home today.    Objective   Blood pressure 136/64, pulse 52, temperature 97.7 F (36.5 C), temperature source Oral, resp. rate 21, height 5\' 7"  (1.702 m), weight 83.689 kg (184 lb 8 oz), SpO2 97 %.   Intake/Output Summary (Last 24 hours) at 05/30/16 0900 Last data filed at 05/30/16 0600  Gross per 24 hour  Intake   1380 ml  Output    776 ml  Net    604 ml    Exam Awake Alert, Oriented x 3, No new F.N deficits, Normal affect [place].AT,PERRAL Supple Neck,No JVD, No cervical lymphadenopathy appriciated.  Symmetrical Chest wall movement, Good air movement bilaterally, few LLL rales RRR,No Gallops,Rubs or new Murmurs, No Parasternal Heave +ve B.Sounds, Abd Soft, Non tender, No organomegaly appriciated, No rebound -guarding or rigidity. No Cyanosis, Clubbing or edema, No new Rash or bruise   Data Review   CBC w Diff: Lab Results  Component Value Date   WBC 4.9 05/30/2016   HGB 12.4* 05/30/2016   HCT 35.3* 05/30/2016   PLT 282 05/30/2016   LYMPHOPCT 26 05/27/2016   MONOPCT 10 05/27/2016   EOSPCT 0 05/27/2016   BASOPCT 0 05/27/2016    CMP: Lab Results  Component Value Date   NA 137 05/30/2016   K 3.7 05/30/2016   CL 103 05/30/2016   CO2 28 05/30/2016   BUN 14 05/30/2016   CREATININE 1.15 05/30/2016    PROT 6.5 05/27/2016   ALBUMIN 3.4* 05/27/2016   BILITOT 0.7 05/27/2016   ALKPHOS 30* 05/27/2016   AST 38 05/27/2016   ALT 36 05/27/2016  .   Total Time in preparing paper work, data evaluation and todays exam - 35 minutes  Thurnell Lose M.D on 05/30/2016 at 9:00 AM  [facility]   Office  [phone]

## 2016-05-30 NOTE — Care Management Important Message (Signed)
Important Message  Patient Details  Name: Hosea Fulk MRN: [id number] Date of Birth: 08-12-1933   Medicare Important Message Given:  Yes    Camillo Flaming 05/30/2016, 10:17 AMImportant Message  Patient Details  Name: Alvar Huscher MRN: [id number] Date of Birth: 1933-09-23   Medicare Important Message Given:  Yes    Camillo Flaming 05/30/2016, 10:17 AM

## 2016-05-30 NOTE — Progress Notes (Signed)
Physical Therapy Treatment Patient Details Name: Jake Acevedo MRN: [id number] DOB: 09-03-1933 Today's Date: 2016-06-12    History of Present Illness 82 yo male admitted  through ED 05/27/16 with cough fever, Work up in ED demonstrated RUL and RLL PNA on CT chest.    PT Comments    Pt is mobilizing very well and denies dyspnea.  Pt ambulated good distance and performed steps without difficulty.  Pt to d/c today.  Follow Up Recommendations  No PT follow up     Equipment Recommendations  None recommended by PT    Recommendations for Other Services       Precautions / Restrictions Precautions Precaution Comments: check sats    Mobility  Bed Mobility                  Transfers Overall transfer level: Modified independent                  Ambulation/Gait Ambulation/Gait assistance: Supervision Ambulation Distance (Feet): 400 Feet Assistive device: None Gait Pattern/deviations: WFL(Within Functional Limits)     General Gait Details: no balance losses, steady gait. slow speed, denies SOB   Stairs Stairs: Yes Stairs assistance: Supervision Stair Management: Alternating pattern;Forwards;One rail Right Number of Stairs: 10 General stair comments: performed stairs without difficulty, SpO2 97% room air after completion  Wheelchair Mobility    Modified Rankin (Stroke Patients Only)       Balance                                    Cognition Arousal/Alertness: Awake/alert Behavior During Therapy: WFL for tasks assessed/performed Overall Cognitive Status: Within Functional Limits for tasks assessed                      Exercises      General Comments        Pertinent Vitals/Pain Pain Assessment: No/denies pain    Home Living                      Prior Function            PT Goals (current goals can now be found in the care plan section) Progress towards PT goals: Progressing toward goals    Frequency  Min  3X/week    PT Plan Current plan remains appropriate    Co-evaluation             End of Session   Activity Tolerance: Patient tolerated treatment well Patient left: in chair;with call bell/phone within reach;with family/visitor present     Time: 0912-0921 PT Time Calculation (min) (ACUTE ONLY): 9 min  Charges:  $Gait Training: 8-22 mins                    G Codes:      Jake Acevedo,Jake Acevedo 06-12-2016, 11:36 AM Jake Acevedo, PT, DPT June 12, 2016 Pager: [phone]

## 2016-05-31 LAB — CULTURE, RESPIRATORY W GRAM STAIN: Culture: NORMAL

## 2016-05-31 LAB — CULTURE, RESPIRATORY

## 2016-06-01 ENCOUNTER — Emergency Department (HOSPITAL_BASED_OUTPATIENT_CLINIC_OR_DEPARTMENT_OTHER)
Admission: EM | Admit: 2016-06-01 | Discharge: 2016-06-01 | Disposition: A | Discharge status: Home / Self Care | Payer: Medicare Other | Attending: Emergency Medicine | Admitting: Emergency Medicine

## 2016-06-01 ENCOUNTER — Encounter (HOSPITAL_BASED_OUTPATIENT_CLINIC_OR_DEPARTMENT_OTHER): Payer: Self-pay | Admitting: Emergency Medicine

## 2016-06-01 DIAGNOSIS — Z87891 Personal history of nicotine dependence: Secondary | ICD-10-CM | POA: Insufficient documentation

## 2016-06-01 DIAGNOSIS — Z8546 Personal history of malignant neoplasm of prostate: Secondary | ICD-10-CM | POA: Diagnosis not present

## 2016-06-01 DIAGNOSIS — B372 Candidiasis of skin and nail: Secondary | ICD-10-CM | POA: Insufficient documentation

## 2016-06-01 DIAGNOSIS — R0602 Shortness of breath: Secondary | ICD-10-CM | POA: Insufficient documentation

## 2016-06-01 DIAGNOSIS — R05 Cough: Secondary | ICD-10-CM | POA: Diagnosis not present

## 2016-06-01 DIAGNOSIS — Z7982 Long term (current) use of aspirin: Secondary | ICD-10-CM | POA: Diagnosis not present

## 2016-06-01 DIAGNOSIS — R21 Rash and other nonspecific skin eruption: Secondary | ICD-10-CM | POA: Diagnosis present

## 2016-06-01 MED ORDER — NYSTATIN 100000 UNIT/GM EX CREA: TOPICAL_CREAM | CUTANEOUS | Status: AC

## 2016-06-01 MED FILL — NYSTATIN 100,000 UNIT/GM CR: 100000 | 15 days supply | Qty: 30 | Fill #0

## 2016-06-01 NOTE — Discharge Instructions (Signed)
Cutaneous Candidiasis °Cutaneous candidiasis is a condition in which there is an overgrowth of yeast (candida) on the skin. Yeast normally live on the skin, but in small enough numbers not to cause any symptoms. In certain cases, increased growth of the yeast may cause an actual yeast infection. This kind of infection usually occurs in areas of the skin that are constantly warm and moist, such as the armpits or the groin. Yeast is the most common cause of diaper rash in babies and in people who cannot control their bowel movements (incontinence). °CAUSES  °The fungus that most often causes cutaneous candidiasis is Candida albicans. Conditions that can increase the risk of getting a yeast infection of the skin include: °· Obesity. °· Pregnancy. °· Diabetes. °· Taking antibiotic medicine. °· Taking birth control pills. °· Taking steroid medicines. °· Thyroid disease. °· An iron or zinc deficiency. °· Problems with the immune system. °SYMPTOMS  °· Red, swollen area of the skin. °· Bumps on the skin. °· Itchiness. °DIAGNOSIS  °The diagnosis of cutaneous candidiasis is usually based on its appearance. Light scrapings of the skin may also be taken and viewed under a microscope to identify the presence of yeast. °TREATMENT  °Antifungal creams may be applied to the infected skin. In severe cases, oral medicines may be needed.  °HOME CARE INSTRUCTIONS  °· Keep your skin clean and dry. °· Maintain a healthy weight. °· If you have diabetes, keep your blood sugar under control. °SEEK IMMEDIATE MEDICAL CARE IF: °· Your rash continues to spread despite treatment. °· You have a fever, chills, or abdominal pain. °  °This information is not intended to replace advice given to you by your health care provider. Make sure you discuss any questions you have with your health care provider. °  °Document Released: 08/30/2011 Document Revised: 03/05/2012 Document Reviewed: 06/15/2015 °Elsevier Interactive Patient Education ©2016 [employer] ° °

## 2016-06-01 NOTE — ED Provider Notes (Signed)
CSN: [id number]     Arrival date & time 06/01/16  1248 History   First MD Initiated Contact with Patient 06/01/16 1330     Chief Complaint  Patient presents with  . Rash     (Consider location/radiation/quality/duration/timing/severity/associated sxs/prior Treatment) Patient is a 82 y.o. male presenting with rash. The history is provided by the patient.  Rash Location:  Torso and leg Severity:  Moderate Onset quality:  Gradual Duration:  3 days Timing:  Constant Progression:  Worsening Chronicity:  New Relieved by:  Nothing Worsened by:  Nothing tried Ineffective treatments:  None tried Associated symptoms: shortness of breath   Associated symptoms: no abdominal pain, no diarrhea, no fever, no headaches, no joint pain, no myalgias and not vomiting    82 yo M With a chief complaint of a rash. Been going on for about 3 days. Patient was recently admitted for community acquired pneumonia noted the rash to his body. Family feels like it's spreading. Deny fevers or chills. Denies sore throat or dysuria. He feels like his cough and congestion has gotten better over the past couple days. This on antibiotics for this.  Past Medical History  Diagnosis Date  . Prostate cancer ([place])   . Pneumonia    History reviewed. No pertinent past surgical history. History reviewed. No pertinent family history. Social History  Substance Use Topics  . Smoking status: Former Research scientist (life sciences)  . Smokeless tobacco: None  . Alcohol Use: No    Review of Systems  Constitutional: Negative for fever and chills.  HENT: Negative for congestion and facial swelling.   Eyes: Negative for discharge and visual disturbance.  Respiratory: Positive for cough and shortness of breath.   Cardiovascular: Negative for chest pain and palpitations.  Gastrointestinal: Negative for vomiting, abdominal pain and diarrhea.  Musculoskeletal: Negative for myalgias and arthralgias.  Skin: Positive for rash. Negative for color change.    Neurological: Negative for tremors, syncope and headaches.  Psychiatric/Behavioral: Negative for confusion and dysphoric mood.      Allergies  Review of patient's allergies indicates no known allergies.  Home Medications   Prior to Admission medications   Medication Sig Start Date End Date Taking? Authorizing Provider  aspirin 81 MG tablet Take 81 mg by mouth daily.    Historical Provider, MD  azithromycin (ZITHROMAX) 500 MG tablet Take 1 tablet (500 mg total) by mouth daily. 05/30/16   Thurnell Lose, MD  cefpodoxime (VANTIN) 200 MG tablet Take 1 tablet (200 mg total) by mouth 2 (two) times daily. 05/30/16   Thurnell Lose, MD  Cholecalciferol (VITAMIN D-3) 1000 units CAPS Take 1,000 Units by mouth daily.    Historical Provider, MD  co-enzyme Q-10 30 MG capsule Take 30 mg by mouth daily. Reported on 05/28/2016    Historical Provider, MD  dextromethorphan-guaiFENesin (TUSSIN DM) 10-100 MG/5ML liquid Take 10 mLs by mouth every 4 (four) hours as needed for cough. 05/30/16   Thurnell Lose, MD  nystatin cream (MYCOSTATIN) Apply to affected area 2 times daily 06/01/16   Deno Etienne, DO  polyethylene glycol (MIRALAX / GLYCOLAX) packet Take 17 g by mouth 2 (two) times daily. 05/30/16   Thurnell Lose, MD  rosuvastatin (CRESTOR) 20 MG tablet Take 20 mg by mouth daily.    Historical Provider, MD  saccharomyces boulardii (FLORASTOR) 250 MG capsule Take 1 capsule (250 mg total) by mouth 2 (two) times daily. 05/30/16   Thurnell Lose, MD   BP 110/74 mmHg  Pulse 67  Temp(Src) 98.3 F (36.8 C) (Oral)  Resp 18  Ht 5\' 7"  (1.702 m)  Wt 184 lb (83.462 kg)  BMI 28.81 kg/m2  SpO2 96% Physical Exam  Constitutional: He is oriented to person, place, and time. He appears well-developed and well-nourished.  HENT:  Head: Normocephalic and atraumatic.  Eyes: EOM are normal. Pupils are equal, round, and reactive to light.  Neck: Normal range of motion. Neck supple. No JVD present.  Cardiovascular: Normal  rate and regular rhythm.  Exam reveals no gallop and no friction rub.   No murmur heard. Pulmonary/Chest: No respiratory distress. He has no wheezes.  Abdominal: He exhibits no distension. There is no tenderness. There is no rebound and no guarding.  Musculoskeletal: Normal range of motion.       Arms: Neurological: He is alert and oriented to person, place, and time.  Skin: No rash noted. No pallor.  Psychiatric: He has a normal mood and affect. His behavior is normal.  Nursing note and vitals reviewed.   ED Course  Procedures (including critical care time) Labs Review Labs Reviewed - No data to display  Imaging Review No results found. I have personally reviewed and evaluated these images and lab results as part of my medical decision-making.   EKG Interpretation None      MDM   Final diagnoses:  Candidiasis of skin    83 yo M with a chief complaint of a rash. This is in the skin folds. There are some satellite lesions. Suspect this is candidal in nature will treat with nystatin cream. PCP follow-up.  3:09 PM:  I have discussed the diagnosis/risks/treatment options with the patient and family and believe the pt to be eligible for discharge home to follow-up with PCP. We also discussed returning to the ED immediately if new or worsening sx occur. We discussed the sx which are most concerning (e.g., sudden worsening pain, fever, inability to tolerate by mouth) that necessitate immediate return. Medications administered to the patient during their visit and any new prescriptions provided to the patient are listed below.  Medications given during this visit Medications - No data to display  Discharge Medication List as of 06/01/2016  1:48 PM    START taking these medications   Details  nystatin cream (MYCOSTATIN) Apply to affected area 2 times daily, Print        The patient appears reasonably screen and/or stabilized for discharge and I doubt any other medical condition  or other [facility] requiring further screening, evaluation, or treatment in the ED at this time prior to discharge.      Deno Etienne, DO 06/01/16 [phone]

## 2016-06-01 NOTE — ED Notes (Signed)
Pt was seen in ER the other day, admitted to hospital and states he started having a rash while in hospital, states they told him it was fine but is still there and spreading.

## 2016-06-02 LAB — CULTURE, BLOOD (ROUTINE X 2)
CULTURE: NO GROWTH
Culture: NO GROWTH

## 2016-09-02 IMAGING — CT CT ANGIO CHEST
2 of 7 series · 19 of 36 positions shown · IV contrast (isovue)
Comparison: 05/27/2016

CLINICAL DATA: Hypoxia, fever, cough

EXAM:
CT ANGIOGRAPHY CHEST WITH CONTRAST
TECHNIQUE: Multidetector CT imaging of the chest was performed using the
standard protocol during bolus administration of intravenous
contrast. Multiplanar CT image reconstructions and MIPs were
obtained to evaluate the vascular anatomy.
CONTRAST:  100 cc Isovue 370

[Series 7: pe coronal mpr · coronal · 0.58mm/px · 1 of 130 slices shown]
[im 65/130  mediastinal]
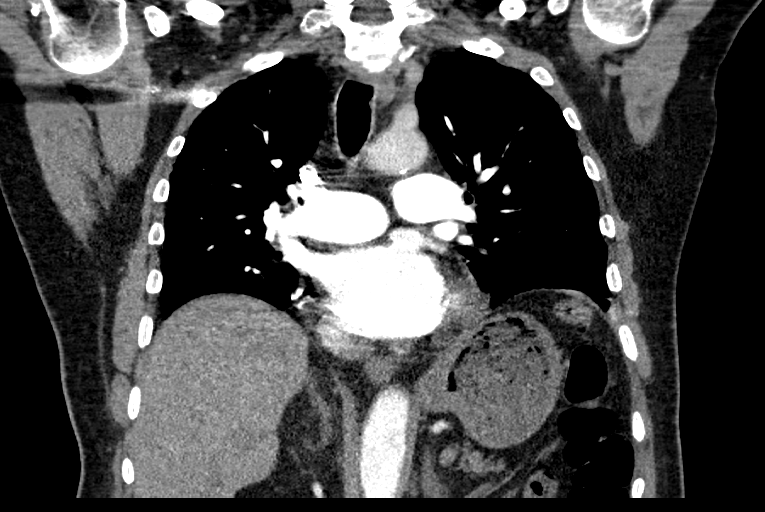

[Series 11: pe thins · axial · 0.80mm/px · z∈[-318,-60]mm · 18 of 289 slices shown]
[im 15/289  lung]
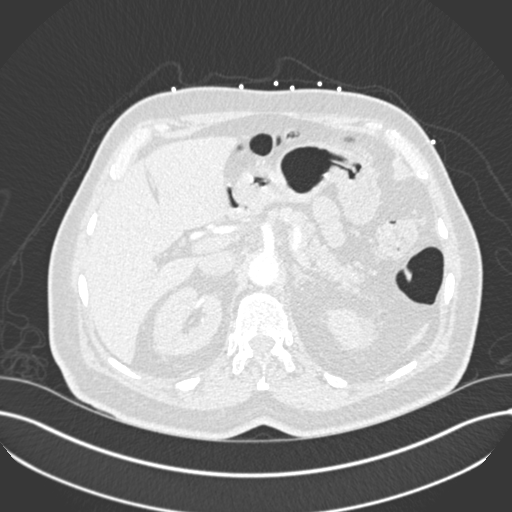
[im 29/289  mediastinal]
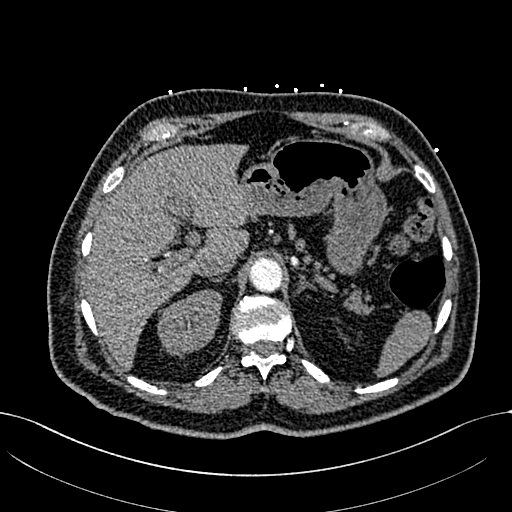
[im 44/289  lung]
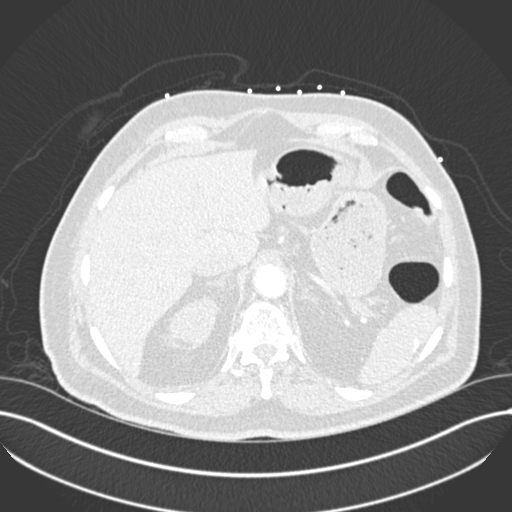
[im 58/289  mediastinal]
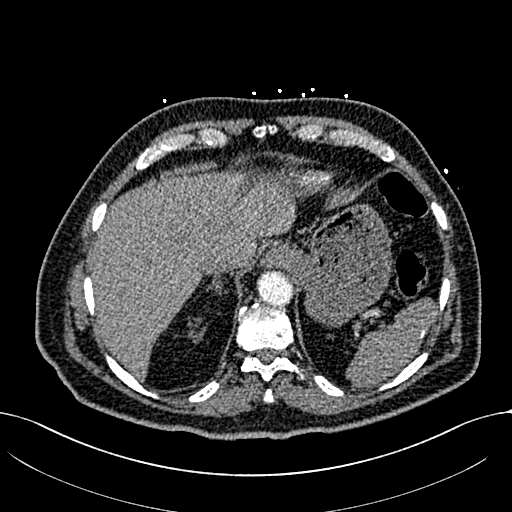
[im 73/289  lung]
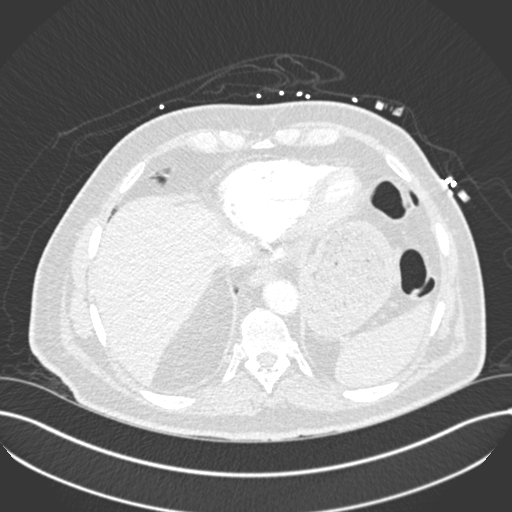
[im 87/289  mediastinal]
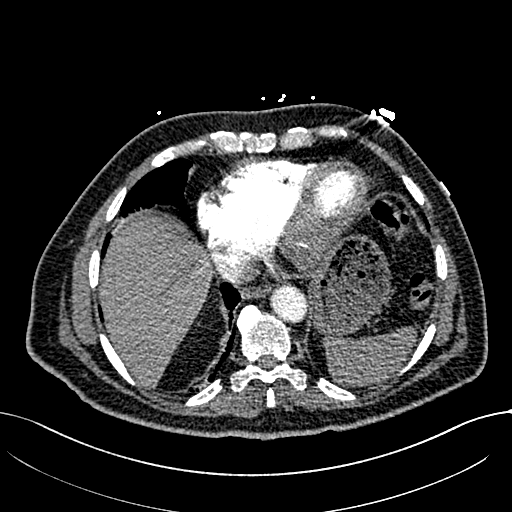
[im 101/289  lung]
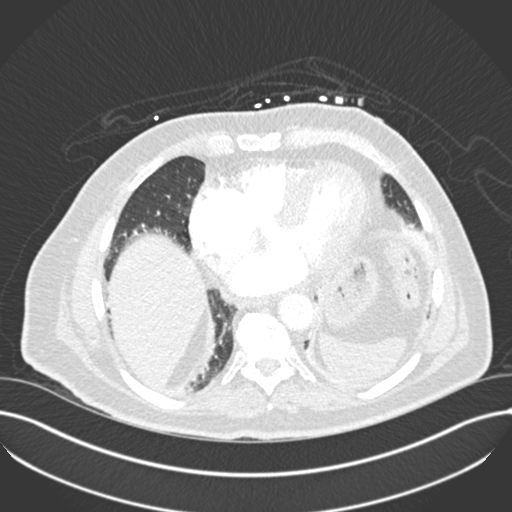
[im 116/289  mediastinal]
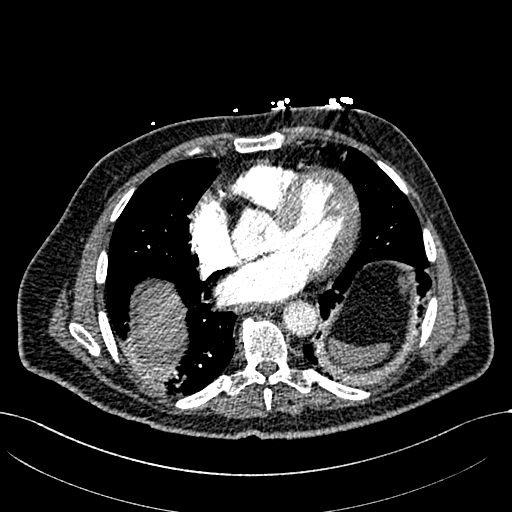
[im 130/289  lung]
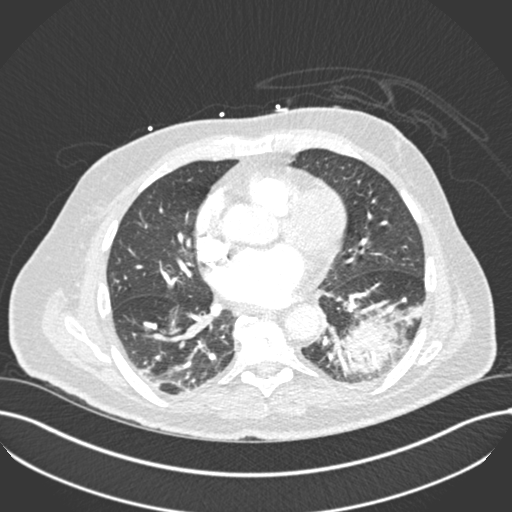
[im 159/289  mediastinal]
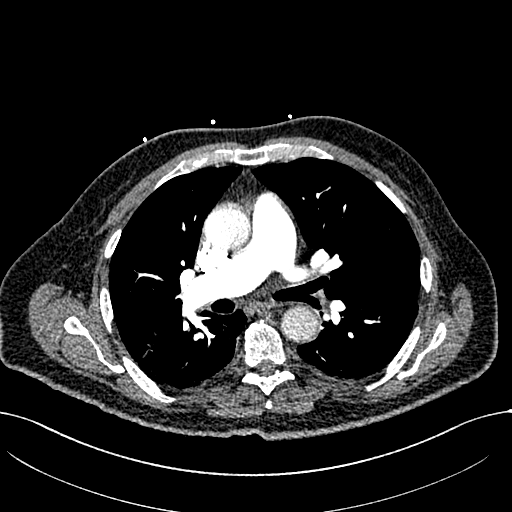
[im 173/289  lung]
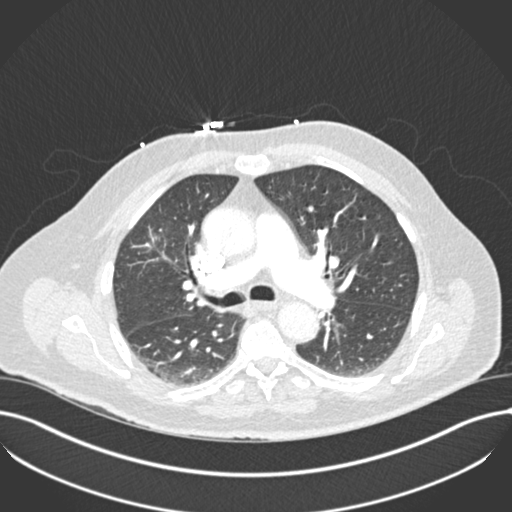
[im 188/289  mediastinal]
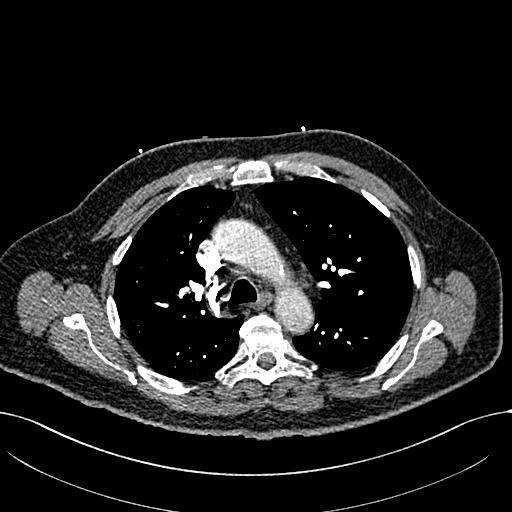
[im 202/289  lung]
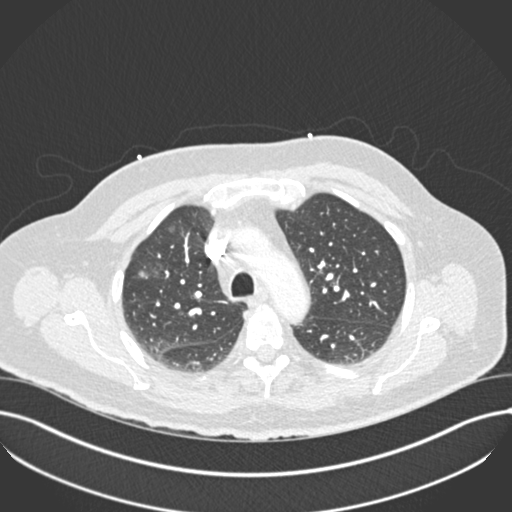
[im 217/289  mediastinal]
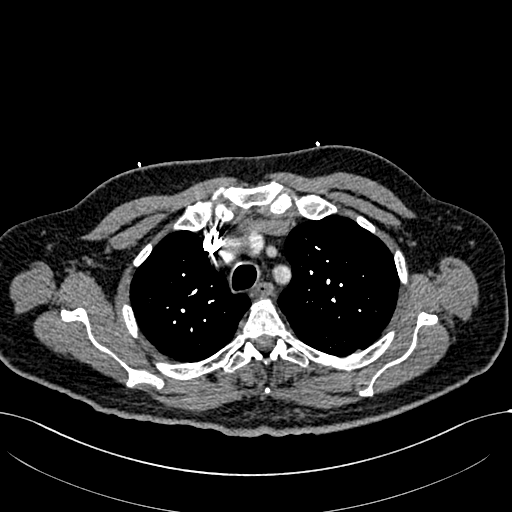
[im 231/289  lung]
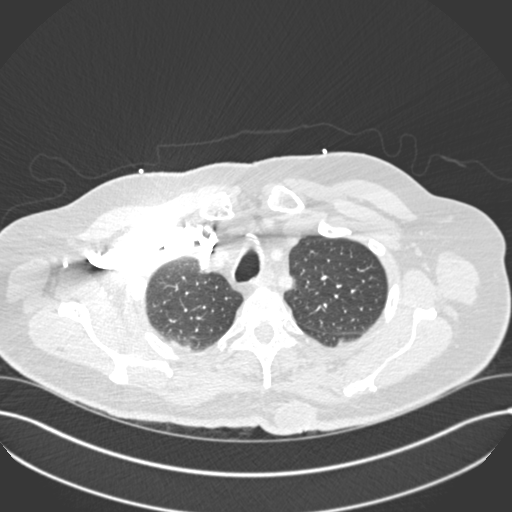
[im 245/289  mediastinal]
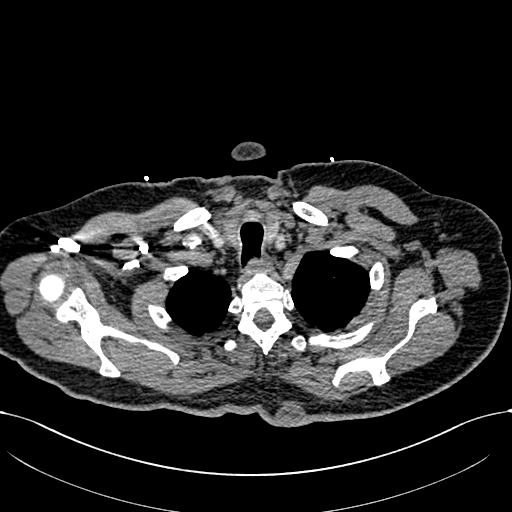
[im 260/289  lung]
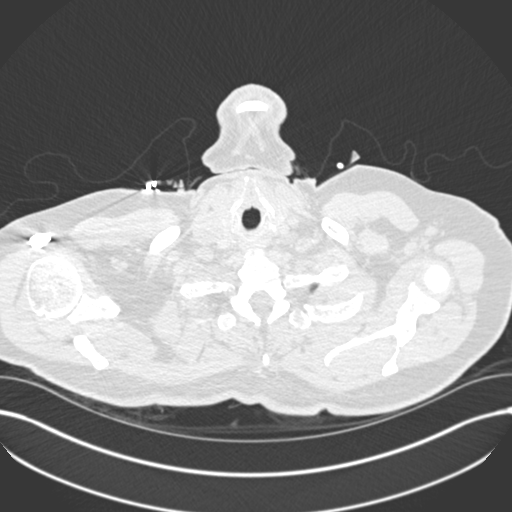
[im 274/289  mediastinal]
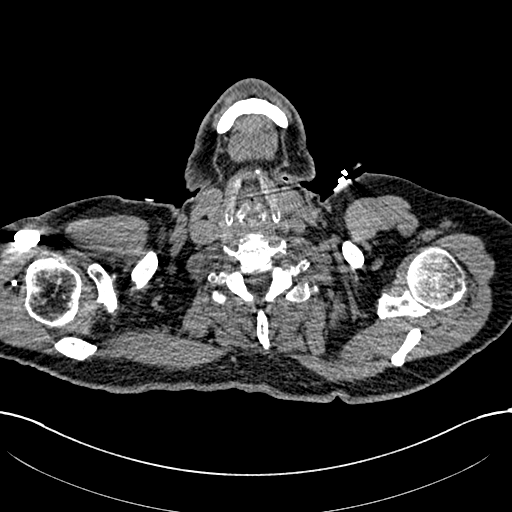

[19 of 36 positions shown; findings below may reference images not displayed]

FINDINGS: Mediastinum/Nodes: No filling defects in the pulmonary arteries to
suggest pulmonary emboli. Heart is upper limits normal in size.
Aorta is normal caliber. No mediastinal, hilar, or axillary
adenopathy. Scattered coronary artery calcifications.

Lungs/Pleura: Bibasilar atelectasis. Patchy ground-glass opacities
in the right upper lobe and superior segment of the right lower lobe
could reflect mild alveolitis/small airways disease. No effusion.

Upper abdomen: Imaging into the upper abdomen shows no acute
findings.

Musculoskeletal: No acute bony abnormality or focal bone lesion.

Review of the MIP images confirms the above findings.
IMPRESSION: Bibasilar atelectasis.

Patchy ground-glass opacities in the right upper lobe and superior
segment of right lower lobe may reflect alveolitis/small airways
disease.

No evidence of pulmonary embolus.

Coronary artery disease.

## 2016-09-05 IMAGING — DX DG CHEST 2V
2 series · 2 of 2 positions shown · non-contrast
Comparison: Yesterday

CLINICAL DATA: Followup community acquired pneumonia

EXAM:
CHEST  2 VIEW

[chest pa]
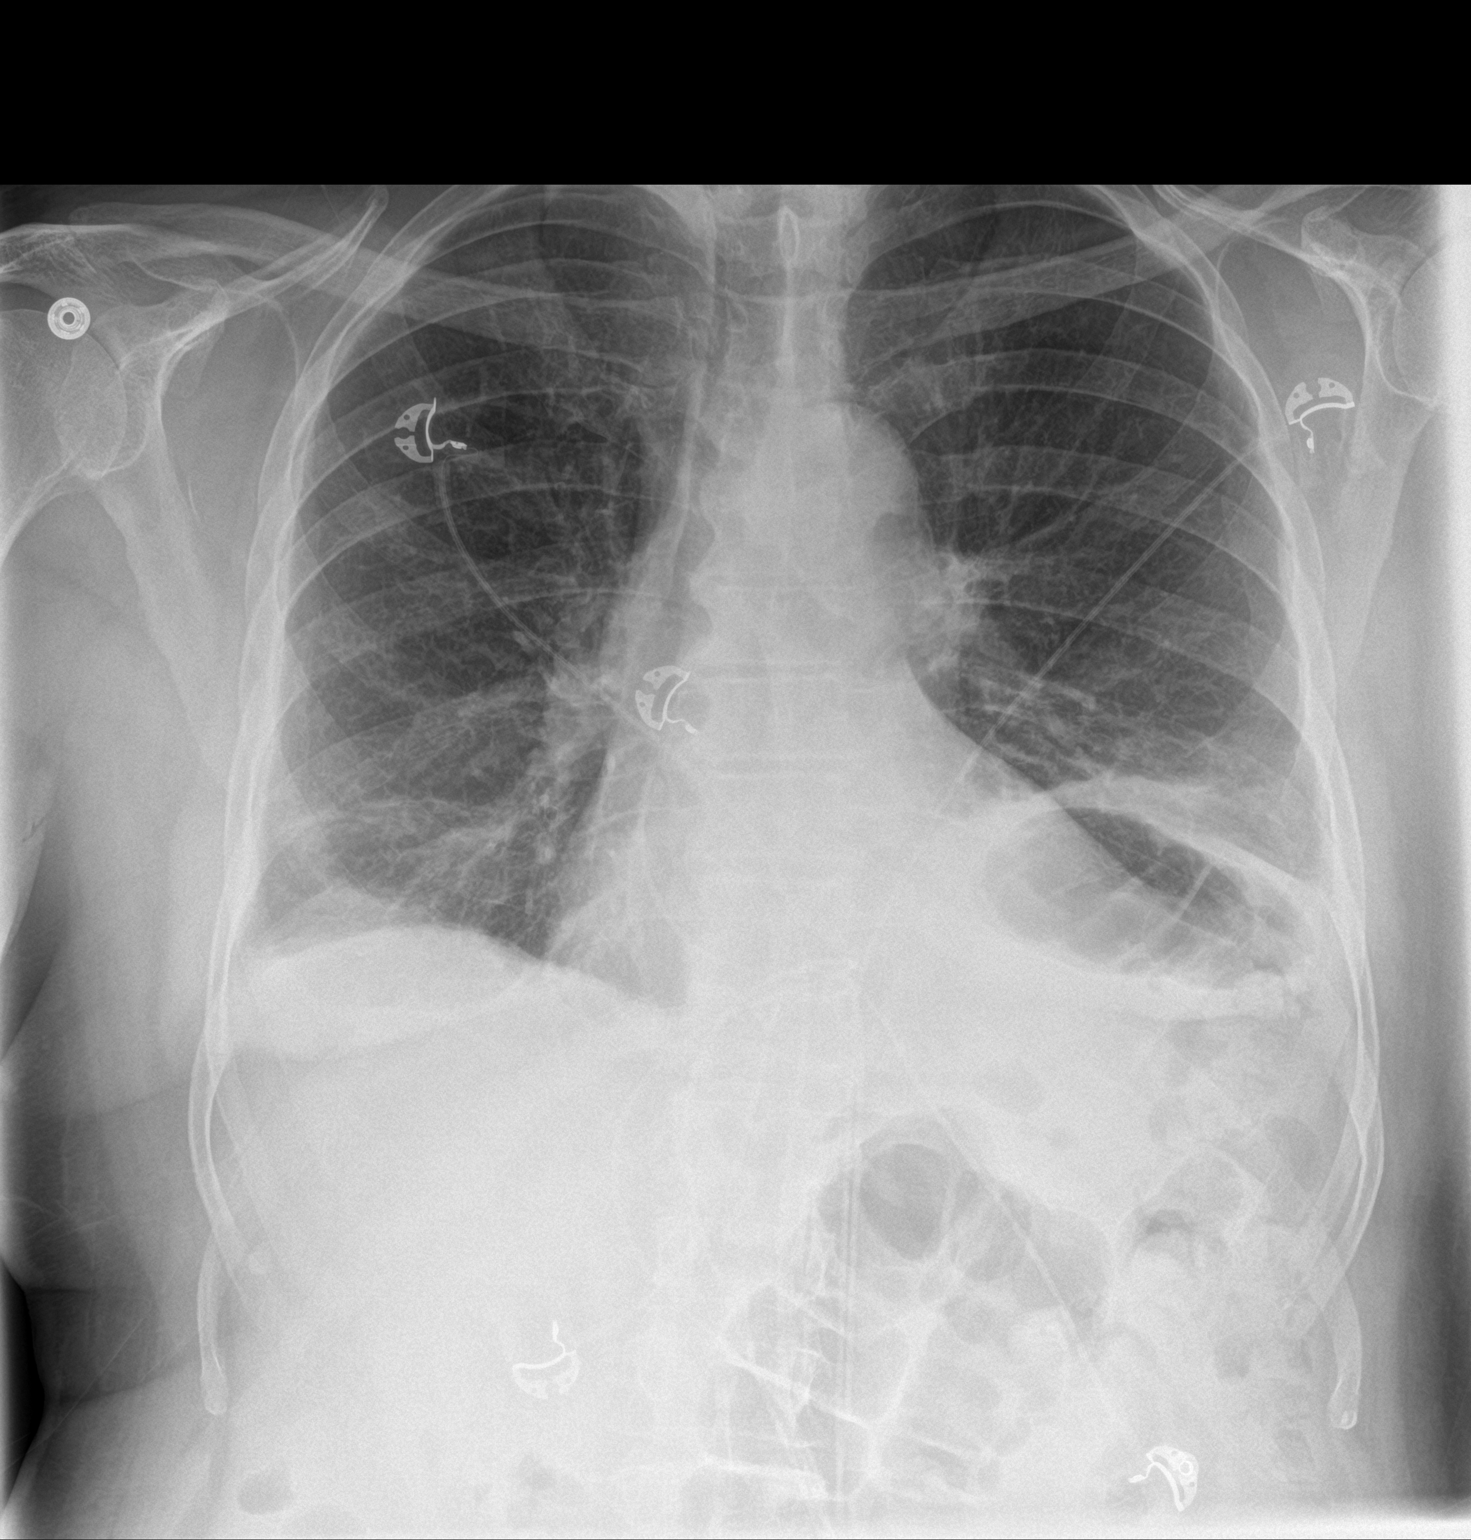

[chest lat]
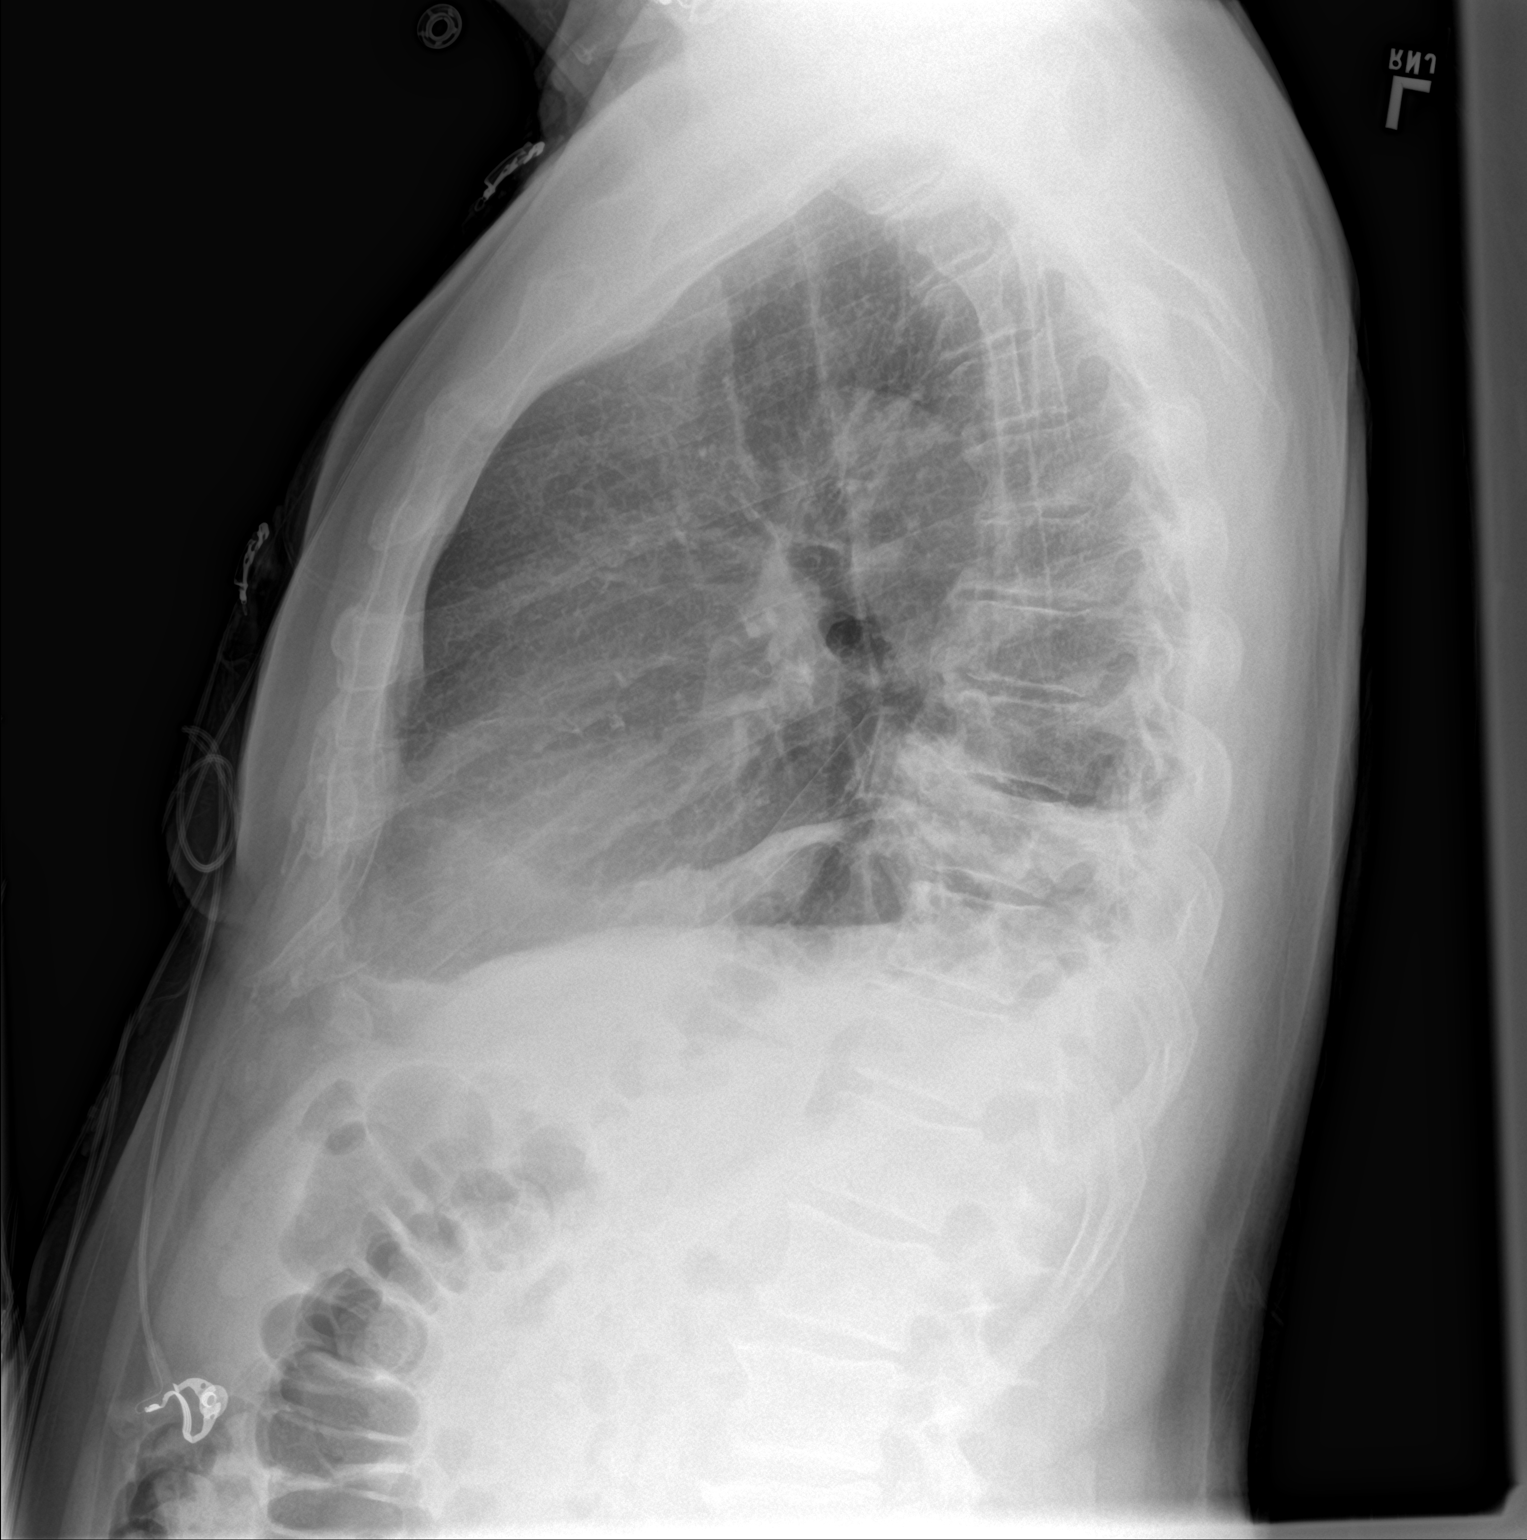

[2 of 2 positions shown; findings below may reference images not displayed]

FINDINGS: Unchanged left basilar opacity with volume loss, presumed
combination of pneumonia and atelectasis. No edema. Possible trace
effusions. No pneumothorax. Normal heart size and mediastinal
contours.
IMPRESSION: History of pneumonia with stable left basilar opacity that is partly
atelectatic.
